# Patient Record
Sex: Male | Born: 1945 | Race: Black or African American | Hispanic: No | Marital: Married | Smoking: Never smoker
Health system: Southern US, Community
[De-identification: ages and names within clinical notes are randomized; demographics above are authoritative.]

## PROBLEM LIST (undated history)

## (undated) DIAGNOSIS — I10 Essential (primary) hypertension: Secondary | ICD-10-CM

## (undated) DIAGNOSIS — C801 Malignant (primary) neoplasm, unspecified: Secondary | ICD-10-CM

## (undated) DIAGNOSIS — E119 Type 2 diabetes mellitus without complications: Secondary | ICD-10-CM

---

## 2013-10-19 ENCOUNTER — Emergency Department (HOSPITAL_COMMUNITY): Payer: Medicare Other

## 2013-10-19 ENCOUNTER — Inpatient Hospital Stay (HOSPITAL_COMMUNITY)
Admission: EM | Admit: 2013-10-19 | Discharge: 2013-10-23 | DRG: 871 | Disposition: A | Discharge status: Hospice | Payer: Medicare Other | Attending: Internal Medicine | Admitting: Internal Medicine

## 2013-10-19 ENCOUNTER — Encounter (HOSPITAL_COMMUNITY): Payer: Self-pay | Admitting: Emergency Medicine

## 2013-10-19 DIAGNOSIS — R609 Edema, unspecified: Secondary | ICD-10-CM | POA: Diagnosis present

## 2013-10-19 DIAGNOSIS — D649 Anemia, unspecified: Secondary | ICD-10-CM

## 2013-10-19 DIAGNOSIS — T50995A Adverse effect of other drugs, medicaments and biological substances, initial encounter: Secondary | ICD-10-CM | POA: Diagnosis not present

## 2013-10-19 DIAGNOSIS — N390 Urinary tract infection, site not specified: Secondary | ICD-10-CM

## 2013-10-19 DIAGNOSIS — R599 Enlarged lymph nodes, unspecified: Secondary | ICD-10-CM | POA: Diagnosis present

## 2013-10-19 DIAGNOSIS — Z66 Do not resuscitate: Secondary | ICD-10-CM | POA: Diagnosis not present

## 2013-10-19 DIAGNOSIS — C7800 Secondary malignant neoplasm of unspecified lung: Secondary | ICD-10-CM | POA: Diagnosis present

## 2013-10-19 DIAGNOSIS — E46 Unspecified protein-calorie malnutrition: Secondary | ICD-10-CM | POA: Diagnosis present

## 2013-10-19 DIAGNOSIS — J189 Pneumonia, unspecified organism: Secondary | ICD-10-CM

## 2013-10-19 DIAGNOSIS — R188 Other ascites: Secondary | ICD-10-CM | POA: Diagnosis present

## 2013-10-19 DIAGNOSIS — G9340 Encephalopathy, unspecified: Secondary | ICD-10-CM | POA: Diagnosis present

## 2013-10-19 DIAGNOSIS — T451X5A Adverse effect of antineoplastic and immunosuppressive drugs, initial encounter: Secondary | ICD-10-CM | POA: Diagnosis present

## 2013-10-19 DIAGNOSIS — R4182 Altered mental status, unspecified: Secondary | ICD-10-CM

## 2013-10-19 DIAGNOSIS — R197 Diarrhea, unspecified: Secondary | ICD-10-CM | POA: Diagnosis not present

## 2013-10-19 DIAGNOSIS — R0682 Tachypnea, not elsewhere classified: Secondary | ICD-10-CM | POA: Diagnosis present

## 2013-10-19 DIAGNOSIS — Z79899 Other long term (current) drug therapy: Secondary | ICD-10-CM

## 2013-10-19 DIAGNOSIS — IMO0002 Reserved for concepts with insufficient information to code with codable children: Secondary | ICD-10-CM

## 2013-10-19 DIAGNOSIS — F411 Generalized anxiety disorder: Secondary | ICD-10-CM | POA: Diagnosis present

## 2013-10-19 DIAGNOSIS — C787 Secondary malignant neoplasm of liver and intrahepatic bile duct: Secondary | ICD-10-CM | POA: Diagnosis present

## 2013-10-19 DIAGNOSIS — E119 Type 2 diabetes mellitus without complications: Secondary | ICD-10-CM | POA: Diagnosis present

## 2013-10-19 DIAGNOSIS — A419 Sepsis, unspecified organism: Principal | ICD-10-CM | POA: Insufficient documentation

## 2013-10-19 DIAGNOSIS — C189 Malignant neoplasm of colon, unspecified: Secondary | ICD-10-CM | POA: Diagnosis present

## 2013-10-19 DIAGNOSIS — I10 Essential (primary) hypertension: Secondary | ICD-10-CM | POA: Diagnosis present

## 2013-10-19 DIAGNOSIS — D6959 Other secondary thrombocytopenia: Secondary | ICD-10-CM | POA: Diagnosis present

## 2013-10-19 DIAGNOSIS — Z515 Encounter for palliative care: Secondary | ICD-10-CM

## 2013-10-19 DIAGNOSIS — D638 Anemia in other chronic diseases classified elsewhere: Secondary | ICD-10-CM | POA: Diagnosis present

## 2013-10-19 HISTORY — DX: Type 2 diabetes mellitus without complications: E11.9

## 2013-10-19 HISTORY — DX: Essential (primary) hypertension: I10

## 2013-10-19 HISTORY — DX: Malignant (primary) neoplasm, unspecified: C80.1

## 2013-10-19 LAB — CBC WITH DIFFERENTIAL/PLATELET
Basophils Absolute: 0 10*3/uL (ref 0.0–0.1)
Basophils Relative: 0 % (ref 0–1)
EOS ABS: 0 10*3/uL (ref 0.0–0.7)
Eosinophils Relative: 0 % (ref 0–5)
HEMATOCRIT: 32.8 % — AB (ref 39.0–52.0)
Hemoglobin: 11.4 g/dL — ABNORMAL LOW (ref 13.0–17.0)
LYMPHS PCT: 48 % — AB (ref 12–46)
Lymphs Abs: 2.7 10*3/uL (ref 0.7–4.0)
MCH: 31.7 pg (ref 26.0–34.0)
MCHC: 34.8 g/dL (ref 30.0–36.0)
MCV: 91.1 fL (ref 78.0–100.0)
MONO ABS: 0.1 10*3/uL (ref 0.1–1.0)
MONOS PCT: 2 % — AB (ref 3–12)
Neutro Abs: 2.9 10*3/uL (ref 1.7–7.7)
Neutrophils Relative %: 50 % (ref 43–77)
Platelets: 109 10*3/uL — ABNORMAL LOW (ref 150–400)
RBC: 3.6 MIL/uL — ABNORMAL LOW (ref 4.22–5.81)
RDW: 14 % (ref 11.5–15.5)
WBC: 5.7 10*3/uL (ref 4.0–10.5)

## 2013-10-19 LAB — URINE MICROSCOPIC-ADD ON

## 2013-10-19 LAB — URINALYSIS, ROUTINE W REFLEX MICROSCOPIC
GLUCOSE, UA: NEGATIVE mg/dL
HGB URINE DIPSTICK: NEGATIVE
KETONES UR: NEGATIVE mg/dL
Nitrite: NEGATIVE
PH: 7.5 (ref 5.0–8.0)
Protein, ur: 30 mg/dL — AB
Specific Gravity, Urine: 1.023 (ref 1.005–1.030)
Urobilinogen, UA: 4 mg/dL — ABNORMAL HIGH (ref 0.0–1.0)

## 2013-10-19 LAB — HEPATIC FUNCTION PANEL
ALBUMIN: 2.8 g/dL — AB (ref 3.5–5.2)
ALK PHOS: 148 U/L — AB (ref 39–117)
ALT: 15 U/L (ref 0–53)
AST: 25 U/L (ref 0–37)
Bilirubin, Direct: 0.2 mg/dL (ref 0.0–0.3)
Indirect Bilirubin: 0.3 mg/dL (ref 0.3–0.9)
Total Bilirubin: 0.5 mg/dL (ref 0.3–1.2)
Total Protein: 6.7 g/dL (ref 6.0–8.3)

## 2013-10-19 LAB — BASIC METABOLIC PANEL
BUN: 15 mg/dL (ref 6–23)
CALCIUM: 9.3 mg/dL (ref 8.4–10.5)
CHLORIDE: 94 meq/L — AB (ref 96–112)
CO2: 25 meq/L (ref 19–32)
CREATININE: 0.87 mg/dL (ref 0.50–1.35)
GFR calc Af Amer: >90 mL/min (ref >90)
GFR calc non Af Amer: 86 mL/min — ABNORMAL LOW (ref >90)
Glucose, Bld: 120 mg/dL — ABNORMAL HIGH (ref 70–99)
Potassium: 4.3 mEq/L (ref 3.7–5.3)
Sodium: 133 mEq/L — ABNORMAL LOW (ref 137–147)

## 2013-10-19 LAB — GLUCOSE, CAPILLARY: Glucose-Capillary: 103 mg/dL — ABNORMAL HIGH (ref 70–99)

## 2013-10-19 LAB — POCT I-STAT TROPONIN I: Troponin i, poc: 0.01 ng/mL (ref 0.00–0.08)

## 2013-10-19 LAB — LIPASE, BLOOD: Lipase: 11 U/L (ref 11–59)

## 2013-10-19 MED ORDER — VANCOMYCIN HCL 10 G IV SOLR
1500.0000 mg | Freq: Once | INTRAVENOUS | Status: AC
  Administered 2013-10-19: 1500 mg via INTRAVENOUS
  Filled 2013-10-19: qty 1500

## 2013-10-19 MED ORDER — DEXTROSE 5 % IV SOLN
2.0000 g | Freq: Once | INTRAVENOUS | Status: DC
  Filled 2013-10-19: qty 2

## 2013-10-19 MED ORDER — SODIUM CHLORIDE 0.9 % IV BOLUS (SEPSIS): 500.0000 mL | Freq: Once | INTRAVENOUS | Status: DC

## 2013-10-19 NOTE — ED Notes (Signed)
Unsuccessful lab draw by this writer. RN made aware. 

## 2013-10-19 NOTE — H&P (Signed)
[facility] History and Physical  Jonathon Hall Jonathon Hall:[id number] DOB: 05/14/1946 DOA: 10/19/2013  Referring physician: ED PCP: follows with Dr Golden Pop , oncologist in [place] ( Phone [phone])  Chief Complaint:  Altered mentals status for past 4-5 days  HPI:  68 year old male with past medical hx of hypertension, diabetes, colon recurrence in 2014 with 10 cc to the liver and the lung and currently on chemotherapy every other week since April 2014. Patient follows with Dr. Lyda Kalata in [facility] and he is here visiting his daughter. Patient has been found to be increasingly sleepy during conversation for4-5 days. Today he was increasingly lethargic and confused. His oncologist had advised him to take oxycodone for his lower abdominal pain as needed and patient reports taking oxycodone 5-6 times a day. Family feels that he may be taking more pills than usual. Patient also was having chills and nasal congestion for last few days.  Patient denies headache, dizziness,  nausea , vomiting, chest pain, palpitations, SOB, , bowel or urinary symptoms. Wife reports significant loss of appetite. No history of fall or syncope. No recent change in medications.  Course in the ED Patient was febrile to 100.67F. Blood pressure was 168/92 mmHg. O2 sat was stable on room air. Blood work done that showed hemoglobin of 11.4, platelets of 109, sodium 133, potassium 4.3, chloride 94, glucose 120. Chest x-ray done that showed diffuse nodular changes throughout both lungs consistent with metastatic disease. UA was suggestive of UTI. Head CT was unremarkable. Patient given 500 cc normal saline bolus. Blood culture was ordered from the ED and patient given an empiric dose of IV vancomycin and cefepime. [facility] called for admission to medical floor. During my evaluation patient is oriented but slow during conversation and irritable at times.  Review of Systems:  Constitutional:  Chills, poor appetite and fatigue. Denies fever, diaphoresis, HEENT: Nasal congestion and cough, Denies photophobia, eye pain, redness, hearing loss, ear pain,  sore throat, rhinorrhea, sneezing, mouth sores, trouble swallowing, neck pain, neck stiffness and tinnitus.   Respiratory: Denies SOB, DOE,  chest tightness,  and wheezing.   Cardiovascular: Denies chest pain, palpitations and leg swelling.  Gastrointestinal: Lower abdominal pain, Denies nausea, vomiting, diarrhea, constipation, blood in stool and abdominal distention.  Genitourinary: Denies dysuria, urgency, frequency, hematuria, flank pain and difficulty urinating.  Endocrine: Denies hot or cold intolerance, polyuria, polydipsia. Musculoskeletal: Denies myalgias, back pain, joint swelling, arthralgias and gait problem.  Skin: Denies pallor, rash and wound.  Neurological: Weakness,  Denies dizziness, seizures, syncope, , light-headedness, numbness and headaches.  Hematological: Denies adenopathy.  Psychiatric/Behavioral: Mood changes and confusion. Denies  sleep disturbance and agitation   Past Medical History  Diagnosis Date  . Cancer     colon cancer  . Hypertension   . Diabetes mellitus without complication    History reviewed. No pertinent past surgical history. Social History:  reports that he has never smoked. He does not have any smokeless tobacco history on file. He reports that he does not drink alcohol. His drug history is not on file.  No Known Allergies  No family history on file.  Prior to Admission medications   Medication Sig Start Date End Date Taking? Authorizing Provider  lisinopril (PRINIVIL,ZESTRIL) 20 MG tablet Take 20 mg by mouth daily.   Yes Historical Provider, MD  metFORMIN (GLUCOPHAGE) 1000 MG tablet Take 1,000 mg by mouth 2 (two) times daily with a meal.   Yes Historical Provider, MD  oxycodone (ROXICODONE) 30 MG  immediate release tablet Take 30 mg by mouth every 4 (four) hours as needed for pain.    Yes Historical Provider, MD     Physical Exam:  Filed Vitals:   10/19/13 1929 10/19/13 2308  BP: 168/92   Pulse: 92 85  Temp: 100.4 F (38 C) 99.5 F (37.5 C)  TempSrc: Oral Oral  Resp: 14 16  SpO2: 96% 93%    Constitutional: Vital signs reviewed. Patient is an elderly thin built male lying in bed in no acute distress  HEENT: Pupils reactive bilaterally, no pallor, no icterus, dry oral mucosa, no cervical lymphadenopathy Cardiovascular: RRR, S1 normal, S2 normal, no MRG Chest: Right-sided Port-A-Cath appears clean , Coarse breath sounds bilaterally, no wheezes, or rhonchi Abdominal: surgical scar over abdomen with protruded mass ? Closed colostomy site. Soft. Non-tender, non-distended, bowel sounds are normal, no guarding or rigidity. GU: no CVA tenderness Ext: warm, trace edema  Neurological: A&O x3, slow to respond with episodes of confusion. Flapping tremors  Labs on Admission:  Basic Metabolic Panel:  Recent Labs Lab 10/19/13 2035  NA 133*  K 4.3  CL 94*  CO2 25  GLUCOSE 120*  BUN 15  CREATININE 0.87  CALCIUM 9.3   Liver Function Tests:  Recent Labs Lab 10/19/13 2035  AST 25  ALT 15  ALKPHOS 148*  BILITOT 0.5  PROT 6.7  ALBUMIN 2.8*    Recent Labs Lab 10/19/13 2035  LIPASE 11   No results found for this basename: AMMONIA,  in the last 168 hours CBC:  Recent Labs Lab 10/19/13 2035  WBC 5.7  NEUTROABS 2.9  HGB 11.4*  HCT 32.8*  MCV 91.1  PLT 109*   Cardiac Enzymes: No results found for this basename: CKTOTAL, CKMB, CKMBINDEX, TROPONINI,  in the last 168 hours BNP: No components found with this basename: POCBNP,  CBG:  Recent Labs Lab 10/19/13 1933  GLUCAP 103*    Radiological Exams on Admission: Dg Chest 2 View  10/19/2013   CLINICAL DATA:  Fever on chemotherapy  EXAM: CHEST  2 VIEW  COMPARISON:  None.  FINDINGS: Cardiac shadow is within normal limits. Nodular changes are noted throughout both lungs likely related to the  metastatic disease. No prior films were available for comparison. A right chest wall port is seen in satisfactory position. No focal confluent infiltrate or effusion is seen. The bony structures are grossly unremarkable.  IMPRESSION: Diffuse nodular changes throughout both lungs most consistent with metastatic disease. No focal infiltrate is seen.   Electronically Signed   By: Inez Catalina M.D.   On: 10/19/2013 21:18   Ct Head Wo Contrast  10/19/2013   CLINICAL DATA:  Abnormal mental status. Stage IV colon cancer. Patient directed to the ER after calling primary care physician.  EXAM: CT HEAD WITHOUT CONTRAST  TECHNIQUE: Contiguous axial images were obtained from the base of the skull through the vertex without intravenous contrast.  COMPARISON:  None.  FINDINGS: No mass lesion, mass effect, midline shift, hydrocephalus, hemorrhage. No acute territorial cortical ischemia/infarct. Atrophy and chronic ischemic white matter disease is present. Atrophy is less than expected for age. Small benign-appearing calcification adjacent to the cingulate gyrus. Allowing for limitations of noncontrast technique, there is no evidence of intracranial metastatic disease. Dense intracranial atherosclerosis. The mastoid air cells appear clear. Pansinusitis is present with apparent fluid levels in the maxillary sinuses, suggesting acute sinusitis. Similar changes are present in the right frontal sinus.  IMPRESSION: 1. No acute intracranial abnormality. Chronic ischemic white  matter disease. 2. Pansinusitis with scattered fluid levels which can be associated with acute sinusitis in the appropriate clinical setting.   Electronically Signed   By: Dereck Ligas M.D.   On: 10/19/2013 21:20       Assessment/Plan  Active Problems:   Acute encephalopathy Possibly in the setting of overuse of narcotic. Patient oriented during my evaluation but has some episode of confusion. He does not have any abdominal discomfort at this time. It  appears that he has been taking extra dose of oxycodone for pain. -Admit to MedSurg. Neurochecks every 4 hours.  -hold oxycodone at this time until mental status improves.  -Head CT unremarkable. He also has some underlying UTI which could contribute to the symptoms. Patient febrile on presentation. We'll check for flu PCR. -cover empirically with IV vancomycin and cefepime for now.  narrow down abx if remain afebrile and mental status improves tomorrow.     UTI (urinary tract infection)  Follow urine culture.  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 Please cll his oncologist in am to gather further information. Patient reports he may have had scns to his brain recently. i will hold off getting an MRI of the brain assuming this is mainly effect of narcotic and UTI rather than brain mets. -Will place on albuterol nebs when necessary Continue O2 via nasal cannula    Anemia Monitor H&H. No baseline available    Diabetes mellitus Hold metformin. We'll place on sliding scale insulin    Hypertension Resume lisinopril   Bilateral leg edema Possibly from hypoalbuminemia. appears malnourished. obtain nutrition consult.   Diet:clears, advance once mental status improves  DVT prophylaxis: sq lovenox, monitor low platelets   Code Status: full code Family Communication: discussed with wife, son and daughter at bedside Disposition Plan: return home once improved  Schwanda Zima, [place] [facility] Pager [phone]  Total time spent on admission :70 minutes  If 7PM-7AM, please contact night-coverage [email or web address] Password [facility] 10/19/2013, 11:28 PM

## 2013-10-19 NOTE — ED Notes (Signed)
EMS called to family home.  Found patient in chair with complaints of weakness. Patient is stage IV colon cancer and called his PCP and was instructed to come  To the ED to be evaluated.  Patient is unable to rate pain but does grimace with  The left rib cage area

## 2013-10-19 NOTE — ED Notes (Signed)
Only one set of blood cultures were obtained due to poor access.

## 2013-10-19 NOTE — ED Provider Notes (Signed)
CSN: [id number]     Arrival date & time 10/19/13  1914 History   First MD Initiated Contact with Patient 10/19/13 1934     No chief complaint on file.    (Consider location/radiation/quality/duration/timing/severity/associated sxs/prior Treatment) HPI Comments: 68 year old male with a history of stage IV colon cancer currently on chemotherapy presents with family for weakness and altered mental status. History taken from family because the patient is somnolent and doesn't answer questions. Per the family's been having more and more weakness over the past week. One and a half weeks ago he received his most recent chemotherapy treatment through his port. Denies any fevers. Denies any vomiting. At this time his chemotherapy is not palliative. His normal oncologist is in [place], [place]. He is up here visiting family. The patient takes 30 mg immediate release oxycodone but has not been taking more than recommended. Is not normally gets sleepy when taking his oxycodone. Family had trouble getting the patient up to walk so they brought him into the ER.   Past Medical History  Diagnosis Date  . Cancer     colon cancer  . Hypertension   . Diabetes mellitus without complication    History reviewed. No pertinent past surgical history. No family history on file. History  Substance Use Topics  . Smoking status: Never Smoker   . Smokeless tobacco: Not on file  . Alcohol Use: No    Review of Systems  Unable to perform ROS: Mental status change      Allergies  Review of patient's allergies indicates no known allergies.  Home Medications   Current Outpatient Rx  Name  Route  Sig  Dispense  Refill  . lisinopril (PRINIVIL,ZESTRIL) 20 MG tablet   Oral   Take 20 mg by mouth daily.         . metFORMIN (GLUCOPHAGE) 1000 MG tablet   Oral   Take 1,000 mg by mouth 2 (two) times daily with a meal.         . oxycodone (ROXICODONE) 30 MG immediate release tablet   Oral   Take  30 mg by mouth every 4 (four) hours as needed for pain.          BP 168/92  Pulse 92  Temp(Src) 100.4 F (38 C) (Oral)  Resp 14  SpO2 96% Physical Exam  Nursing note and vitals reviewed. Constitutional: He appears well-developed and well-nourished. No distress.  HENT:  Head: Normocephalic and atraumatic.  Right Ear: External ear normal.  Left Ear: External ear normal.  Nose: Nose normal.  Dry mucous membranes  Eyes: Right eye exhibits no discharge. Left eye exhibits no discharge.  Neck: Neck supple.  Cardiovascular: Normal rate, regular rhythm, normal heart sounds and intact distal pulses.   Pulmonary/Chest: Effort normal.  Abdominal: Soft. There is tenderness.  Musculoskeletal: He exhibits no edema.  Neurological: He is alert. He is disoriented. GCS eye subscore is 3. GCS verbal subscore is 4. GCS motor subscore is 6.  Grossly normal strength in all 4 extremities. For CN testing he does not follow commands. When asking to open his eyes he states he can't, then forcibly resists me trying to pull up his eyelids.   Skin: Skin is warm and dry.    ED Course  Procedures (including critical care time) Labs Review Labs Reviewed  CBC WITH DIFFERENTIAL - Abnormal; Notable for the following:    RBC 3.60 (*)    Hemoglobin 11.4 (*)    HCT 32.8 (*)  Platelets 109 (*)    Lymphocytes Relative 48 (*)    Monocytes Relative 2 (*)    All other components within normal limits  BASIC METABOLIC PANEL - Abnormal; Notable for the following:    Sodium 133 (*)    Chloride 94 (*)    Glucose, Bld 120 (*)    GFR calc non Af Amer 86 (*)    All other components within normal limits  URINALYSIS, ROUTINE W REFLEX MICROSCOPIC - Abnormal; Notable for the following:    Color, Urine AMBER (*)    Bilirubin Urine SMALL (*)    Protein, ur 30 (*)    Urobilinogen, UA 4.0 (*)    Leukocytes, UA TRACE (*)    All other components within normal limits  GLUCOSE, CAPILLARY - Abnormal; Notable for the  following:    Glucose-Capillary 103 (*)    All other components within normal limits  HEPATIC FUNCTION PANEL - Abnormal; Notable for the following:    Albumin 2.8 (*)    Alkaline Phosphatase 148 (*)    All other components within normal limits  URINE MICROSCOPIC-ADD ON - Abnormal; Notable for the following:    Casts HYALINE CASTS (*)    All other components within normal limits  CULTURE, BLOOD (ROUTINE X 2)  CULTURE, BLOOD (ROUTINE X 2)  LIPASE, BLOOD  POCT I-STAT TROPONIN I   Imaging Review Dg Chest 2 View  10/19/2013   CLINICAL DATA:  Fever on chemotherapy  EXAM: CHEST  2 VIEW  COMPARISON:  None.  FINDINGS: Cardiac shadow is within normal limits. Nodular changes are noted throughout both lungs likely related to the metastatic disease. No prior films were available for comparison. A right chest wall port is seen in satisfactory position. No focal confluent infiltrate or effusion is seen. The bony structures are grossly unremarkable.  IMPRESSION: Diffuse nodular changes throughout both lungs most consistent with metastatic disease. No focal infiltrate is seen.   Electronically Signed   By: Inez Catalina M.D.   On: 10/19/2013 21:18   Ct Head Wo Contrast  10/19/2013   CLINICAL DATA:  Abnormal mental status. Stage IV colon cancer. Patient directed to the ER after calling primary care physician.  EXAM: CT HEAD WITHOUT CONTRAST  TECHNIQUE: Contiguous axial images were obtained from the base of the skull through the vertex without intravenous contrast.  COMPARISON:  None.  FINDINGS: No mass lesion, mass effect, midline shift, hydrocephalus, hemorrhage. No acute territorial cortical ischemia/infarct. Atrophy and chronic ischemic white matter disease is present. Atrophy is less than expected for age. Small benign-appearing calcification adjacent to the cingulate gyrus. Allowing for limitations of noncontrast technique, there is no evidence of intracranial metastatic disease. Dense intracranial  atherosclerosis. The mastoid air cells appear clear. Pansinusitis is present with apparent fluid levels in the maxillary sinuses, suggesting acute sinusitis. Similar changes are present in the right frontal sinus.  IMPRESSION: 1. No acute intracranial abnormality. Chronic ischemic white matter disease. 2. Pansinusitis with scattered fluid levels which can be associated with acute sinusitis in the appropriate clinical setting.   Electronically Signed   By: Dereck Ligas M.D.   On: 10/19/2013 21:20    EKG Interpretation    Date/Time:  Thursday October 19 2013 19:17:35 EST Ventricular Rate:  88 PR Interval:  138 QRS Duration: 76 QT Interval:  367 QTC Calculation: 444 R Axis:   8 Text Interpretation:  Age not entered, assumed to be  68 years old for purpose of ECG interpretation Sinus rhythm Anteroseptal infarct,  old Abnrm T, consider ischemia, anterolateral lds No old tracing to compare Confirmed by [place]  MD, [place] [phone]) on 10/19/2013 8:09:26 PM            MDM   Final diagnoses:  Altered mental status    Patient's mental status somewhat improved, appeared awake and talkative. However, appears confused and slightly altered. Family confirms this is not his normal self. Given this with his history of chemo and metastatic disease, will need to be brought in to hospital for further w/u. Given his low grade temps, will cover broadly given his immunosuppressed state and high chance of blood borne infections with chest port in place.    Ephraim Hamburger, MD 10/19/13 [phone]

## 2013-10-20 ENCOUNTER — Inpatient Hospital Stay (HOSPITAL_COMMUNITY): Payer: Medicare Other

## 2013-10-20 ENCOUNTER — Encounter (HOSPITAL_COMMUNITY): Payer: Self-pay | Admitting: *Deleted

## 2013-10-20 DIAGNOSIS — C787 Secondary malignant neoplasm of liver and intrahepatic bile duct: Secondary | ICD-10-CM

## 2013-10-20 DIAGNOSIS — M7989 Other specified soft tissue disorders: Secondary | ICD-10-CM

## 2013-10-20 DIAGNOSIS — E119 Type 2 diabetes mellitus without complications: Secondary | ICD-10-CM

## 2013-10-20 DIAGNOSIS — I10 Essential (primary) hypertension: Secondary | ICD-10-CM

## 2013-10-20 DIAGNOSIS — A419 Sepsis, unspecified organism: Secondary | ICD-10-CM | POA: Insufficient documentation

## 2013-10-20 LAB — GLUCOSE, CAPILLARY
GLUCOSE-CAPILLARY: 167 mg/dL — AB (ref 70–99)
Glucose-Capillary: 103 mg/dL — ABNORMAL HIGH (ref 70–99)
Glucose-Capillary: 106 mg/dL — ABNORMAL HIGH (ref 70–99)
Glucose-Capillary: 129 mg/dL — ABNORMAL HIGH (ref 70–99)

## 2013-10-20 LAB — INFLUENZA PANEL BY PCR (TYPE A & B)
H1N1 flu by pcr: NOT DETECTED
INFLAPCR: NEGATIVE
Influenza B By PCR: NEGATIVE

## 2013-10-20 LAB — BASIC METABOLIC PANEL
BUN: 14 mg/dL (ref 6–23)
CO2: 23 mEq/L (ref 19–32)
CREATININE: 0.78 mg/dL (ref 0.50–1.35)
Calcium: 9.1 mg/dL (ref 8.4–10.5)
Chloride: 94 mEq/L — ABNORMAL LOW (ref 96–112)
GFR calc Af Amer: >90 mL/min (ref >90)
Glucose, Bld: 123 mg/dL — ABNORMAL HIGH (ref 70–99)
Potassium: 4 mEq/L (ref 3.7–5.3)
SODIUM: 132 meq/L — AB (ref 137–147)

## 2013-10-20 LAB — CBC
HCT: 33.7 % — ABNORMAL LOW (ref 39.0–52.0)
Hemoglobin: 11.7 g/dL — ABNORMAL LOW (ref 13.0–17.0)
MCH: 31.5 pg (ref 26.0–34.0)
MCHC: 34.7 g/dL (ref 30.0–36.0)
MCV: 90.6 fL (ref 78.0–100.0)
Platelets: 100 10*3/uL — ABNORMAL LOW (ref 150–400)
RBC: 3.72 MIL/uL — AB (ref 4.22–5.81)
RDW: 13.9 % (ref 11.5–15.5)
WBC: 5.8 10*3/uL (ref 4.0–10.5)

## 2013-10-20 LAB — AMMONIA: Ammonia: 21 umol/L (ref 11–60)

## 2013-10-20 MED ORDER — HYDRALAZINE HCL 20 MG/ML IJ SOLN
10.0000 mg | Freq: Four times a day (QID) | INTRAMUSCULAR | Status: DC | PRN
Start: 2013-10-20 — End: 2013-10-20
  Filled 2013-10-20: qty 0.5

## 2013-10-20 MED ORDER — ONDANSETRON HCL 4 MG/2ML IJ SOLN
4.0000 mg | Freq: Four times a day (QID) | INTRAMUSCULAR | Status: DC | PRN
  Administered 2013-10-21 – 2013-10-23 (×3): 4 mg via INTRAVENOUS
  Filled 2013-10-20 (×2): qty 2

## 2013-10-20 MED ORDER — LABETALOL HCL 100 MG PO TABS
100.0000 mg | ORAL_TABLET | Freq: Three times a day (TID) | ORAL | Status: DC
  Administered 2013-10-20 (×2): 100 mg via ORAL
  Filled 2013-10-20 (×5): qty 1

## 2013-10-20 MED ORDER — ACETAMINOPHEN 650 MG RE SUPP: 650.0000 mg | Freq: Four times a day (QID) | RECTAL | Status: DC | PRN

## 2013-10-20 MED ORDER — ENOXAPARIN SODIUM 40 MG/0.4ML ~~LOC~~ SOLN
40.0000 mg | SUBCUTANEOUS | Status: DC
Start: 2013-10-20 — End: 2013-10-21
  Administered 2013-10-20: 40 mg via SUBCUTANEOUS
  Filled 2013-10-20 (×2): qty 0.4

## 2013-10-20 MED ORDER — VANCOMYCIN HCL IN DEXTROSE 750-5 MG/150ML-% IV SOLN
750.0000 mg | Freq: Two times a day (BID) | INTRAVENOUS | Status: DC
  Administered 2013-10-20 – 2013-10-22 (×5): 750 mg via INTRAVENOUS
  Filled 2013-10-20 (×6): qty 150

## 2013-10-20 MED ORDER — IOHEXOL 300 MG/ML  SOLN: 25.0000 mL | Freq: Once | INTRAMUSCULAR | Status: AC | PRN

## 2013-10-20 MED ORDER — HYDRALAZINE HCL 20 MG/ML IJ SOLN
20.0000 mg | Freq: Four times a day (QID) | INTRAMUSCULAR | Status: DC | PRN
  Administered 2013-10-20: 10 mg via INTRAVENOUS
  Administered 2013-10-22 – 2013-10-23 (×2): 20 mg via INTRAVENOUS
  Filled 2013-10-20 (×2): qty 1

## 2013-10-20 MED ORDER — HYDRALAZINE HCL 25 MG PO TABS
25.0000 mg | ORAL_TABLET | Freq: Three times a day (TID) | ORAL | Status: DC
  Administered 2013-10-20 (×3): 25 mg via ORAL
  Filled 2013-10-20 (×6): qty 1

## 2013-10-20 MED ORDER — CHLORPROMAZINE HCL 10 MG PO TABS
10.0000 mg | ORAL_TABLET | Freq: Three times a day (TID) | ORAL | Status: DC | PRN
  Administered 2013-10-20 – 2013-10-22 (×2): 10 mg via ORAL
  Filled 2013-10-20 (×2): qty 1

## 2013-10-20 MED ORDER — IOHEXOL 300 MG/ML  SOLN
80.0000 mL | Freq: Once | INTRAMUSCULAR | Status: AC | PRN
  Administered 2013-10-20: 80 mL via INTRAVENOUS

## 2013-10-20 MED ORDER — DEXTROSE 5 % IV SOLN
1.0000 g | Freq: Three times a day (TID) | INTRAVENOUS | Status: DC
  Administered 2013-10-20 – 2013-10-22 (×6): 1 g via INTRAVENOUS
  Filled 2013-10-20 (×8): qty 1

## 2013-10-20 MED ORDER — ENSURE COMPLETE PO LIQD
237.0000 mL | Freq: Two times a day (BID) | ORAL | Status: DC
  Administered 2013-10-20 – 2013-10-23 (×7): 237 mL via ORAL

## 2013-10-20 MED ORDER — HYDRALAZINE HCL 20 MG/ML IJ SOLN
10.0000 mg | Freq: Once | INTRAMUSCULAR | Status: AC
  Administered 2013-10-20: 10 mg via INTRAVENOUS
  Filled 2013-10-20: qty 0.5

## 2013-10-20 MED ORDER — ONDANSETRON HCL 4 MG PO TABS: 4.0000 mg | ORAL_TABLET | Freq: Four times a day (QID) | ORAL | Status: DC | PRN

## 2013-10-20 MED ORDER — IOHEXOL 350 MG/ML SOLN
100.0000 mL | Freq: Once | INTRAVENOUS | Status: AC | PRN
  Administered 2013-10-20: 100 mL via INTRAVENOUS

## 2013-10-20 MED ORDER — HYDRALAZINE HCL 20 MG/ML IJ SOLN
10.0000 mg | Freq: Once | INTRAMUSCULAR | Status: AC
  Administered 2013-10-20: 10 mg via INTRAVENOUS
  Filled 2013-10-20 (×2): qty 0.5

## 2013-10-20 MED ORDER — ACETAMINOPHEN 325 MG PO TABS
650.0000 mg | ORAL_TABLET | Freq: Four times a day (QID) | ORAL | Status: DC | PRN
  Administered 2013-10-20: 650 mg via ORAL
  Filled 2013-10-20 (×2): qty 2

## 2013-10-20 MED ORDER — SODIUM CHLORIDE 0.9 % IV SOLN
INTRAVENOUS | Status: DC
  Administered 2013-10-20 – 2013-10-21 (×2): via INTRAVENOUS

## 2013-10-20 MED ORDER — INSULIN ASPART 100 UNIT/ML ~~LOC~~ SOLN
0.0000 [IU] | Freq: Three times a day (TID) | SUBCUTANEOUS | Status: DC
  Administered 2013-10-20: 2 [IU] via SUBCUTANEOUS
  Administered 2013-10-20: 1 [IU] via SUBCUTANEOUS
  Administered 2013-10-21: 2 [IU] via SUBCUTANEOUS
  Administered 2013-10-21: 3 [IU] via SUBCUTANEOUS
  Administered 2013-10-21 – 2013-10-22 (×2): 1 [IU] via SUBCUTANEOUS
  Administered 2013-10-22 (×2): 2 [IU] via SUBCUTANEOUS
  Administered 2013-10-23 (×2): 1 [IU] via SUBCUTANEOUS

## 2013-10-20 MED ORDER — DOCUSATE SODIUM 100 MG PO CAPS
100.0000 mg | ORAL_CAPSULE | Freq: Every day | ORAL | Status: DC | PRN
  Filled 2013-10-20: qty 1

## 2013-10-20 MED ORDER — CEFEPIME HCL 2 G IJ SOLR
2.0000 g | INTRAMUSCULAR | Status: AC
  Administered 2013-10-20: 2 g via INTRAVENOUS
  Filled 2013-10-20: qty 2

## 2013-10-20 MED ORDER — LISINOPRIL 20 MG PO TABS
20.0000 mg | ORAL_TABLET | Freq: Every day | ORAL | Status: DC
  Administered 2013-10-20: 20 mg via ORAL
  Filled 2013-10-20 (×2): qty 1

## 2013-10-20 NOTE — Progress Notes (Signed)
ANTIBIOTIC CONSULT NOTE - INITIAL  Pharmacy Consult for Cefepime/Vancomycin Indication: Sepsis  No Known Allergies  Patient Measurements: Height: 5\' 7"  (170.2 cm) Weight: 144 lb 13.5 oz (65.7 kg) IBW/kg (Calculated) : 66.1   Vital Signs: Temp: 100.6 F (38.1 C) (02/13 0003) Temp src: Oral (02/13 0003) BP: 162/79 mmHg (02/13 0100) Pulse Rate: 90 (02/13 0100) Intake/Output from previous day: 02/12 0701 - 02/13 0700 In: -  Out: 100 [Urine:100] Intake/Output from this shift: Total I/O In: -  Out: 100 [Urine:100]  Labs:  Recent Labs  10/19/13 2035 10/20/13 0233  WBC 5.7 5.8  HGB 11.4* 11.7*  PLT 109* 100*  CREATININE 0.87 0.78   Estimated Creatinine Clearance: 81 ml/min (by C-G formula based on Cr of 0.78). No results found for this basename: VANCOTROUGH, VANCOPEAK, VANCORANDOM, GENTTROUGH, GENTPEAK, GENTRANDOM, TOBRATROUGH, TOBRAPEAK, TOBRARND, AMIKACINPEAK, AMIKACINTROU, AMIKACIN,  in the last 72 hours   Microbiology: No results found for this or any previous visit (from the past 720 hour(s)).  Medical History: Past Medical History  Diagnosis Date  . Cancer     colon cancer  . Hypertension   . Diabetes mellitus without complication     Medications:  Scheduled:  . ceFEPime (MAXIPIME) IV  1 g Intravenous Q8H  . ceFEPime (MAXIPIME) IV  2 g Intravenous STAT  . enoxaparin (LOVENOX) injection  40 mg Subcutaneous Q24H  . insulin aspart  0-9 Units Subcutaneous TID WC  . lisinopril  20 mg Oral Daily  . sodium chloride  500 mL Intravenous Once  . vancomycin  750 mg Intravenous Q12H   Infusions:  . sodium chloride 75 mL/hr at 10/20/13 0056   Assessment: 68 yo with hx HTN, DM, colon Ca receiving chemo @ [facility] admitted today with altered mental status x 4-5 days.  Cefepime and Vancomycin per Rx for Sepsis.  Goal of Therapy:  Vancomycin trough level 15-20 mcg/ml  Plan:   Cefepime 2Gm x1 then 1Gm IV q8h  Vancomycin 1500mg  x1 (ER MD) then 750mg  IV  q12h  F/U SCr/cultures/levels as needed.  Dorrene German 10/20/2013,4:46 AM

## 2013-10-20 NOTE — Progress Notes (Signed)
VASCULAR LAB PRELIMINARY  PRELIMINARY  PRELIMINARY  PRELIMINARY  Bilateral lower extremity venous duplex completed.    Preliminary report:  Bilateral:  No evidence of DVT, superficial thrombosis, or Baker's Cyst.   Pakou Rainbow, RVS 10/20/2013, 12:08 PM

## 2013-10-20 NOTE — Progress Notes (Signed)
INITIAL NUTRITION ASSESSMENT  DOCUMENTATION CODES Per approved criteria  -Not Applicable   INTERVENTION: - Ensure Complete BID - Will continue to monitor   NUTRITION DIAGNOSIS: Increased nutrient needs related to metastatic colon CA as evidenced by MD notes.   Goal: Pt to consume >90% of meals/supplements  Monitor:  Weights, labs, intake   Reason for Assessment: Malnutrition screening tool   68 y.o. male  Admitting Dx: Altered mental status for past 4-5 days  ASSESSMENT: Pt with history hypertension, diabetes, colon CA with recurrence in 2014 with metastases to the liver and the lung and currently on chemotherapy every other week since April 2014. Patient has been found to be more lethargic and confused. His oncologist had advised him to take oxycodone for his lower abdominal pain as needed and patient reports taking oxycodone 5-6 times a day. Family feels that he may be taking more pills than usual. Patient also was having chills and nasal congestion for last few days. Wife reports significant loss of appetite.  Met with pt and wife, pt was working on lunch during visit. Pt reports eating well PTA with stable weight however wife was shaking her head and indicating pt with significant weight loss but did not report how much. No weight trend available. PO intake has been 50% of meals.   Alk phos elevated    Height: Ht Readings from Last 1 Encounters:  10/20/13 _0  (1.702 m)    Weight: Wt Readings from Last 1 Encounters:  10/20/13 144 lb 13.5 oz (65.7 kg)    Ideal Body Weight: 148 lb   % Ideal Body Weight: 97%  Wt Readings from Last 10 Encounters:  10/20/13 144 lb 13.5 oz (65.7 kg)    Usual Body Weight: Pt unsure    BMI:  Body mass index is 22.68 kg/(m^2).  Estimated Nutritional Needs: Kcal: 1650-1850 Protein: 80-90g Fluid: 1.6-1.8L/day  Skin: Non-pitting RLE, LLE edema  Diet Order: General  EDUCATION NEEDS: -No education needs identified at this  time   Intake/Output Summary (Last 24 hours) at 10/20/13 1511 Last data filed at 10/20/13 1430  Gross per 24 hour  Intake    120 ml  Output    100 ml  Net     20 ml    Last BM: 2/11  Labs:   Recent Labs Lab 10/19/13 2035 10/20/13 0233  NA 133* 132*  K 4.3 4.0  CL 94* 94*  CO2 25 23  BUN 15 14  CREATININE 0.87 0.78  CALCIUM 9.3 9.1  GLUCOSE 120* 123*    CBG (last 3)   Recent Labs  10/20/13 0058 10/20/13 0757 10/20/13 1208  GLUCAP 103* 106* 167*    Scheduled Meds: . ceFEPime (MAXIPIME) IV  1 g Intravenous Q8H  . enoxaparin (LOVENOX) injection  40 mg Subcutaneous Q24H  . feeding supplement (ENSURE COMPLETE)  237 mL Oral BID BM  . hydrALAZINE  25 mg Oral TID  . insulin aspart  0-9 Units Subcutaneous TID WC  . lisinopril  20 mg Oral Daily  . sodium chloride  500 mL Intravenous Once  . vancomycin  750 mg Intravenous Q12H    Continuous Infusions: . sodium chloride 75 mL/hr at 10/20/13 0056    Past Medical History  Diagnosis Date  . Cancer     colon cancer  . Hypertension   . Diabetes mellitus without complication     History reviewed. No pertinent past surgical history.  Mikey College MS, [place], [place] Pager [phone] After Hours Pager

## 2013-10-20 NOTE — Progress Notes (Addendum)
[facility] PROGRESS NOTE  Jonathon Hall JKK:[id number] DOB: [birth year] DOA: 10/19/2013 PCP: No primary provider on file.  Assessment/Plan  Acute encephalopathy, may be due to overuse of narcotic or delirium from ongoing fevers due to infection or progressive malignancy.   -  Continue neuro checks q4h today -  Minimize sedating medications -  CT head neg -  Consider MRI brain to eval for metastatic cancer  SIRS (fever and tachypnea), unclear source, WBC count currently normal, however, patient receiving ongoing chemotherapy.  Also consider PE given tachypnea and metastatic cancer.   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  Started on FOLFOX but had progression so changed to FOLFIRI and Avastin summer 2014.  Continues to have progression.  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  All treatments are palliative.  Per last clinic note 10/12/2013, "An open and frank discussion was held separately with [patient's] son regarding realistic expectations from treatment and prognosis." Dr. Golden Pop, primary oncologist.  I am worried that this patient has a very limited life expectancy and poor prognosis.  Unless he improves with antibiotics, I would anticipate death in days to weeks.   -  Discussed code status today with wife, who stated that Jonathon Hall would want everything done to extend his life, even painful or uncomfortable procedures, including intubation, CPR, etc.   -   Stated he is not currently a good candidate for chemotherapy  because of his probable sepsis.  If he recovers well from this infection, chemotherapy can be readdressed by his oncologist -  Discussed case with Dr. Howie Ill, one of Dr. Lynnell Jude partners as he is out of town for the next week, who stated that if Jonathon Hall is not recovering well from this current insult, based on the progression of cancer and Dr. Lynnell Jude notes, she would support hospice care/palliative care.   -  Will discuss palliative care consult with family again tomorrow  Anemia of chronic disease, hemoglobin stable, no need for transfusion  Thrombocytopenia, mild and stable, acute phase reactant.  Monitor clinically for signs of DIC -  Repeat CBC in AM  Diabetes mellitus CBG well controlled -  A1c -  Continue low dose SSI  Hypertension blood pressure very elevated -  Continue lisinopril  -  Added hydralazine this morning, but BPs remained 716R-678L systolic  -  Add labetalol  Bilateral leg edema, anasarca from malignancy and malnutrition -  Nutrition consult appreciated -  Duplex lower extremities negative for DVT  Diet:  regular Access:  port IVF:  yes Proph:  lovenox  Code Status: full Family Communication: patient and wife Disposition Plan: pending clinical improvement.  Still very ill.  Will order OOB and PT/OT to slow deconditioning    Consultants:  Oncology at [facility] in [place], Dr. Howie Ill, by phone.  Ph:  [phone].  FAX:  [phone].    Procedures:  CXR  CT head  CT chest/abd/pelvis  Antibiotics:  Vancomycin 2/12 >>  Cefepime 2/13  HPI/Subjective:  Patient confused.  Cannot remember how many children he has.  Tangiential and rambling.    Objective: Filed Vitals:   10/20/13 0100 10/20/13 0633 10/20/13 0934 10/20/13 1429  BP: 162/79 191/93  183/95  Pulse: 90 81  78  Temp:  100.7 F (38.2 C) 98.8 F (37.1 C) 99.5 F (37.5 C)  TempSrc:  Oral  Oral  Resp:  22  20  Height: $Remove'5\' 7"'weijTDI$  (1.702 m)     Weight: 65.7  kg (144 lb 13.5 oz)     SpO2:  100%  93%    Intake/Output Summary (Last 24 hours) at 10/20/13 1649 Last data filed at 10/20/13 1430  Gross per 24 hour  Intake    120 ml  Output    100 ml  Net     20 ml   Filed Weights   10/20/13 0100  Weight: 65.7 kg (144 lb 13.5 oz)    Exam:   General:  Cachectic BM, No acute distress  HEENT:  NCAT, MMM  Cardiovascular:  Tachycardic regular rhythm, nl S1, S2 3/6 systolic murmur LSB, 2+ pulses, warm extremities  Respiratory:  CTAB, no increased WOB  Abdomen:   NABS, soft, large fungating mass central abdomen, hepatomegaly.  No discharge or erythema.  No obvious TTP  MSK:   Decreased tone and bulk, 1+ bilateral LEE  Neuro:  Grossly moves all extremities  Psych:  Oriented to person only.    Data Reviewed: Basic Metabolic Panel:  Recent Labs Lab 10/19/13 2035 10/20/13 0233  NA 133* 132*  K 4.3 4.0  CL 94* 94*  CO2 25 23  GLUCOSE 120* 123*  BUN 15 14  CREATININE 0.87 0.78  CALCIUM 9.3 9.1   Liver Function Tests:  Recent Labs Lab 10/19/13 2035  AST 25  ALT 15  ALKPHOS 148*  BILITOT 0.5  PROT 6.7  ALBUMIN 2.8*    Recent Labs Lab 10/19/13 2035  LIPASE 11    Recent Labs Lab 10/20/13 0233  AMMONIA 21   CBC:  Recent Labs Lab 10/19/13 2035 10/20/13 0233  WBC 5.7 5.8  NEUTROABS 2.9  --   HGB 11.4* 11.7*  HCT 32.8* 33.7*  MCV 91.1 90.6  PLT 109* 100*   Cardiac Enzymes: No results found for this basename: CKTOTAL, CKMB, CKMBINDEX, TROPONINI,  in the last 168 hours BNP (last 3 results) No results found for this basename: PROBNP,  in the last 8760 hours CBG:  Recent Labs Lab 10/19/13 1933 10/20/13 0058 10/20/13 0757 10/20/13 1208  GLUCAP 103* 103* 106* 167*    No results found for this or any previous visit (from the past 240 hour(s)).   Studies: Dg Chest 2 View  10/19/2013   CLINICAL DATA:  Fever on chemotherapy  EXAM: CHEST  2 VIEW  COMPARISON:  None.  FINDINGS: Cardiac shadow is within  normal limits. Nodular changes are noted throughout both lungs likely related to the metastatic disease. No prior films were available for comparison. A right chest wall port is seen in satisfactory position. No focal confluent infiltrate or effusion is seen. The bony structures are grossly unremarkable.  IMPRESSION: Diffuse nodular changes throughout both lungs most consistent with metastatic disease. No focal infiltrate is seen.   Electronically Signed   By: Inez Catalina M.D.   On: 10/19/2013 21:18   Ct Head Wo Contrast  10/19/2013   CLINICAL DATA:  Abnormal mental status. Stage IV colon cancer. Patient directed to the ER after calling primary care physician.  EXAM: CT HEAD WITHOUT CONTRAST  TECHNIQUE: Contiguous axial images were obtained from the base of the skull through the vertex without intravenous contrast.  COMPARISON:  None.  FINDINGS: No mass lesion, mass effect, midline shift, hydrocephalus, hemorrhage. No acute territorial cortical ischemia/infarct. Atrophy and chronic ischemic white matter disease is present. Atrophy is less than expected for age. Small benign-appearing calcification adjacent to the cingulate gyrus. Allowing for limitations of noncontrast technique, there is no evidence of intracranial metastatic disease. Dense intracranial atherosclerosis. The mastoid air cells appear clear. Pansinusitis is present with apparent fluid levels in the maxillary sinuses, suggesting acute sinusitis. Similar changes are present in the right frontal sinus.  IMPRESSION: 1. No acute intracranial abnormality. Chronic ischemic white matter disease. 2. Pansinusitis with scattered fluid levels which can be associated with acute sinusitis in the appropriate clinical setting.   Electronically Signed   By: Dereck Ligas M.D.   On: 10/19/2013 21:20    Scheduled Meds: . ceFEPime (MAXIPIME) IV  1 g Intravenous Q8H  . enoxaparin (LOVENOX) injection  40 mg Subcutaneous Q24H  . feeding supplement (ENSURE  COMPLETE)  237 mL Oral BID BM  . hydrALAZINE  25 mg Oral TID  . insulin aspart  0-9 Units Subcutaneous TID WC  . lisinopril  20 mg Oral Daily  . sodium chloride  500 mL Intravenous Once  . vancomycin  750 mg Intravenous Q12H   Continuous Infusions: . sodium chloride 75 mL/hr at 10/20/13 0056    Active Problems:   Acute encephalopathy   UTI (urinary tract infection)   Metastatic colon cancer to liver   Colon carcinoma metastatic to lung   Anemia   Diabetes mellitus   Hypertension   Protein calorie malnutrition    Time spent: 30 min    Kristalyn Bergstresser, [facility] Pager [phone]. If 7PM-7AM, please contact night-coverage at [email or web address], password [facility] 10/20/2013, 4:49 PM  LOS: 1 day

## 2013-10-21 ENCOUNTER — Inpatient Hospital Stay (HOSPITAL_COMMUNITY): Payer: Medicare Other

## 2013-10-21 DIAGNOSIS — J189 Pneumonia, unspecified organism: Secondary | ICD-10-CM

## 2013-10-21 DIAGNOSIS — R188 Other ascites: Secondary | ICD-10-CM

## 2013-10-21 DIAGNOSIS — R509 Fever, unspecified: Secondary | ICD-10-CM

## 2013-10-21 DIAGNOSIS — R4182 Altered mental status, unspecified: Secondary | ICD-10-CM

## 2013-10-21 LAB — COMPREHENSIVE METABOLIC PANEL
ALBUMIN: 2.3 g/dL — AB (ref 3.5–5.2)
ALK PHOS: 132 U/L — AB (ref 39–117)
ALT: 15 U/L (ref 0–53)
AST: 25 U/L (ref 0–37)
BUN: 13 mg/dL (ref 6–23)
CALCIUM: 8.7 mg/dL (ref 8.4–10.5)
CO2: 22 mEq/L (ref 19–32)
Chloride: 94 mEq/L — ABNORMAL LOW (ref 96–112)
Creatinine, Ser: 0.68 mg/dL (ref 0.50–1.35)
GFR calc Af Amer: >90 mL/min (ref >90)
GFR calc non Af Amer: >90 mL/min (ref >90)
Glucose, Bld: 149 mg/dL — ABNORMAL HIGH (ref 70–99)
POTASSIUM: 3.5 meq/L — AB (ref 3.7–5.3)
Sodium: 130 mEq/L — ABNORMAL LOW (ref 137–147)
TOTAL PROTEIN: 6.2 g/dL (ref 6.0–8.3)
Total Bilirubin: 0.4 mg/dL (ref 0.3–1.2)

## 2013-10-21 LAB — GLUCOSE, CAPILLARY
GLUCOSE-CAPILLARY: 191 mg/dL — AB (ref 70–99)
Glucose-Capillary: 137 mg/dL — ABNORMAL HIGH (ref 70–99)
Glucose-Capillary: 246 mg/dL — ABNORMAL HIGH (ref 70–99)
Glucose-Capillary: 150 mg/dL — ABNORMAL HIGH (ref 70–99)

## 2013-10-21 LAB — CBC WITH DIFFERENTIAL/PLATELET
BASOS PCT: 0 % (ref 0–1)
Basophils Absolute: 0 10*3/uL (ref 0.0–0.1)
EOS ABS: 0 10*3/uL (ref 0.0–0.7)
Eosinophils Relative: 0 % (ref 0–5)
HEMATOCRIT: 32 % — AB (ref 39.0–52.0)
Hemoglobin: 11.2 g/dL — ABNORMAL LOW (ref 13.0–17.0)
Lymphocytes Relative: 33 % (ref 12–46)
Lymphs Abs: 2.3 10*3/uL (ref 0.7–4.0)
MCH: 31.4 pg (ref 26.0–34.0)
MCHC: 35 g/dL (ref 30.0–36.0)
MCV: 89.6 fL (ref 78.0–100.0)
MONO ABS: 0.4 10*3/uL (ref 0.1–1.0)
MONOS PCT: 5 % (ref 3–12)
NEUTROS ABS: 4.4 10*3/uL (ref 1.7–7.7)
Neutrophils Relative %: 62 % (ref 43–77)
Platelets: 112 10*3/uL — ABNORMAL LOW (ref 150–400)
RBC: 3.57 MIL/uL — ABNORMAL LOW (ref 4.22–5.81)
RDW: 14.2 % (ref 11.5–15.5)
WBC: 7.1 10*3/uL (ref 4.0–10.5)

## 2013-10-21 LAB — HEMOGLOBIN A1C
Hgb A1c MFr Bld: 5.8 % — ABNORMAL HIGH (ref <5.7)
MEAN PLASMA GLUCOSE: 120 mg/dL — AB (ref <117)

## 2013-10-21 LAB — LACTIC ACID, PLASMA: LACTIC ACID, VENOUS: 1.5 mmol/L (ref 0.5–2.2)

## 2013-10-21 LAB — URINE CULTURE
COLONY COUNT: NO GROWTH
Culture: NO GROWTH

## 2013-10-21 LAB — LIPASE, BLOOD: Lipase: 15 U/L (ref 11–59)

## 2013-10-21 MED ORDER — LISINOPRIL 40 MG PO TABS
40.0000 mg | ORAL_TABLET | Freq: Every day | ORAL | Status: DC
  Administered 2013-10-21 – 2013-10-23 (×3): 40 mg via ORAL
  Filled 2013-10-21 (×3): qty 1

## 2013-10-21 MED ORDER — HYDRALAZINE HCL 50 MG PO TABS
50.0000 mg | ORAL_TABLET | Freq: Three times a day (TID) | ORAL | Status: DC
  Administered 2013-10-21 – 2013-10-23 (×7): 50 mg via ORAL
  Filled 2013-10-21 (×9): qty 1

## 2013-10-21 MED ORDER — GADOBENATE DIMEGLUMINE 529 MG/ML IV SOLN
13.0000 mL | Freq: Once | INTRAVENOUS | Status: AC | PRN
  Administered 2013-10-21: 13 mL via INTRAVENOUS

## 2013-10-21 MED ORDER — OXYCODONE HCL 5 MG PO TABS
10.0000 mg | ORAL_TABLET | ORAL | Status: DC | PRN
  Administered 2013-10-22: 10 mg via ORAL
  Filled 2013-10-21: qty 2

## 2013-10-21 MED ORDER — METRONIDAZOLE IN NACL 5-0.79 MG/ML-% IV SOLN
500.0000 mg | Freq: Three times a day (TID) | INTRAVENOUS | Status: DC
  Administered 2013-10-21: 500 mg via INTRAVENOUS
  Filled 2013-10-21 (×3): qty 100

## 2013-10-21 MED ORDER — DOCUSATE SODIUM 100 MG PO CAPS
100.0000 mg | ORAL_CAPSULE | Freq: Two times a day (BID) | ORAL | Status: DC
  Administered 2013-10-21 – 2013-10-22 (×3): 100 mg via ORAL
  Filled 2013-10-21 (×4): qty 1

## 2013-10-21 MED ORDER — SENNA 8.6 MG PO TABS
2.0000 | ORAL_TABLET | Freq: Every day | ORAL | Status: DC
  Administered 2013-10-21: 17.2 mg via ORAL
  Filled 2013-10-21: qty 2

## 2013-10-21 MED ORDER — WHITE PETROLATUM GEL
Status: DC | PRN
  Administered 2013-10-21: 1 via TOPICAL
  Filled 2013-10-21: qty 5

## 2013-10-21 MED ORDER — LABETALOL HCL 200 MG PO TABS
200.0000 mg | ORAL_TABLET | Freq: Three times a day (TID) | ORAL | Status: DC
  Administered 2013-10-21 – 2013-10-23 (×7): 200 mg via ORAL
  Filled 2013-10-21 (×9): qty 1

## 2013-10-21 MED ORDER — SALINE SPRAY 0.65 % NA SOLN
1.0000 | NASAL | Status: DC | PRN
  Administered 2013-10-21: 1 via NASAL
  Filled 2013-10-21: qty 44

## 2013-10-21 MED ORDER — OXYMETAZOLINE HCL 0.05 % NA SOLN
2.0000 | NASAL | Status: DC | PRN
  Administered 2013-10-21: 2 via NASAL
  Filled 2013-10-21: qty 15

## 2013-10-21 NOTE — Progress Notes (Signed)
Occupational Therapy Evaluation Patient Details Name: Jonathon Hall MRN: [id number] DOB: 04/09/1946 Today's Date: 10/21/2013 Time: 2836-6294 OT Time Calculation (min): 22 min  OT Assessment / Plan / Recommendation History of present illness 68 year old male with past medical hx of hypertension, diabetes, colon recurrence in 2014 with 10 cc to the liver and the lung and currently on chemotherapy every other week since April 2014. Patient follows with Dr. Lyda Kalata in [facility] and he is here visiting his daughter. Patient has been found to be increasingly sleepy during conversation for4-5 days. Today he was increasingly lethargic and confused. His oncologist had advised him to take oxycodone for his lower abdominal pain as needed and patient reports taking oxycodone 5-6 times a day. Family feels that he may be taking more pills than usual. Patient also was having chills and nasal congestion for last few days.     Clinical Impression   Completed education with pt/family regarding need for minguard during ADL and mobility for ADL due to pt being fall risk due to weakness. Also rec for pt to use 3 in 1 for toileting and shower. Pt given theraband to complete ex on own and rec for sqeeze ball to complete grip strengthening. Family said they would get one for him. Family states they can provide level of assist needed for home D/C. Marland Kitchen Pt with questions regarding pain mgnt. Notified nurse.     OT Assessment  Patient does not need any further OT services    Follow Up Recommendations  No OT follow up;Supervision/Assistance - 24 hour    Barriers to Discharge      Equipment Recommendations  3 in 1 bedside comode    Recommendations for Other Services    Frequency       Precautions / Restrictions Precautions Precautions: Fall   Pertinent Vitals/Pain No c/o pain    ADL  ADL Comments: Pt requires minguard for ADL due to fall risk. discussed home safety with family. rec use of 3 in 1 and minguard  for shower transfers.     OT Diagnosis:    OT Problem List:   OT Treatment Interventions:     OT Goals(Current goals can be found in the care plan section) Acute Rehab OT Goals Patient Stated Goal: Go to daughter's house OT Goal Formulation:  (eval only)  Visit Information  Last OT Received On: 10/21/13 Assistance Needed: +1 History of Present Illness: 68 year old male with past medical hx of hypertension, diabetes, colon recurrence in 2014 with 10 cc to the liver and the lung and currently on chemotherapy every other week since April 2014. Patient follows with Dr. Lyda Kalata in [facility] and he is here visiting his daughter. Patient has been found to be increasingly sleepy during conversation for4-5 days. Today he was increasingly lethargic and confused. His oncologist had advised him to take oxycodone for his lower abdominal pain as needed and patient reports taking oxycodone 5-6 times a day. Family feels that he may be taking more pills than usual. Patient also was having chills and nasal congestion for last few days.         Prior [place] expects to be discharged to:: Private residence (d/c to daughter's house in [place]) Living Arrangements: Spouse/significant other (lives in [place]) Type of Home: House Home Access: Stairs to enter Technical brewer of Steps: 2 [place]: Two level;1/2 bath on main level (daughter's house) Home Equipment: None Additional Comments: Pt will be d/c to daughter's house  in [place] Prior Function Level of Independence: Independent Communication Communication: No difficulties Dominant Hand: Right         Vision/Perception     Cognition  Cognition Arousal/Alertness: Lethargic;Suspect due to medications Behavior During Therapy: Flat affect Overall Cognitive Status: Impaired/Different from baseline Area of Impairment: Attention;Memory;Following  commands;Safety/judgement;Awareness;Problem solving Current Attention Level: Selective Memory: Decreased short-term memory Following Commands: Follows one step commands with increased time Safety/Judgement: Decreased awareness of safety Awareness: Emergent Problem Solving: Slow processing;Requires verbal cues    Extremity/Trunk Assessment Upper Extremity Assessment Upper Extremity Assessment: Generalized weakness;RUE deficits/detail;LUE deficits/detail RUE Deficits / Details: decreased sensation B hands - most likely realted to chemo Lower Extremity Assessment Lower Extremity Assessment: Generalized weakness Cervical / Trunk Assessment Cervical / Trunk Assessment: Normal     Mobility Bed Mobility Overal bed mobility: Needs Assistance Bed Mobility: Supine to Sit;Sit to Supine Supine to sit: Supervision;HOB elevated Sit to supine: Supervision;HOB elevated Transfers Overall transfer level: Needs assistance Equipment used: 1 person hand held assist Transfers: Sit to/from Stand Sit to Stand: Min guard     Exercise Other Exercises Other Exercises: given theraband to work on [place] Overall balance assessment: Needs assistance Sitting-balance support: Feet supported;No upper extremity supported Sitting balance-Leahy Scale: Good Standing balance support: No upper extremity supported Standing balance-Leahy Scale: Fair General Comments General comments (skin integrity, edema, etc.): c/o darkened areas on B hands   End of Session OT - End of Session Equipment Utilized During Treatment: Gait belt Activity Tolerance: Patient tolerated treatment well Patient left: in bed;with call bell/phone within reach;with family/visitor present Nurse Communication: Mobility status;Other (comment) (pt with questions regading pain management)  GO     Shanyia Stines,HILLARY 10/21/2013, 4:05 PM [facility], OTR/L  [phone] 10/21/2013

## 2013-10-21 NOTE — Evaluation (Signed)
Physical Therapy Evaluation Patient Details Name: Jonathon Hall MRN: [id number] DOB: 22-Mar-1946 Today's Date: 10/21/2013 Time: 0918-1000 PT Time Calculation (min): 42 min  PT Assessment / Plan / Recommendation History of Present Illness  68 year old male with past medical hx of hypertension, diabetes, colon recurrence in 2014 with 10 cc to the liver and the lung and currently on chemotherapy every other week since April 2014. Patient follows with Dr. Lyda Kalata in [facility] and he is here visiting his daughter. Patient has been found to be increasingly sleepy during conversation for4-5 days. Today he was increasingly lethargic and confused. His oncologist had advised him to take oxycodone for his lower abdominal pain as needed and patient reports taking oxycodone 5-6 times a day. Family feels that he may be taking more pills than usual. Patient also was having chills and nasal congestion for last few days.    Clinical Impression  Pt admitted with above. Pt currently with functional limitations due to the deficits listed below (see PT Problem List).  Pt will benefit from skilled PT to increase their independence and safety with mobility to allow discharge to the venue listed below. Will d/c to daughter's house in [place] as he and wife live in [place].. Wife unable to physically assist pt.     PT Assessment  Patient needs continued PT services    Follow Up Recommendations  Home health PT    Does the patient have the potential to tolerate intense rehabilitation      Barriers to Discharge Inaccessible home environment flight of stairs to get to bedroom at daughter's house in [place].  Pt lives in [place].    Equipment Recommendations  Rolling walker with 5" wheels    Recommendations for Other Services     Frequency Min 3X/week    Precautions / Restrictions     Pertinent Vitals/Pain Nausea, no c/o pain      Mobility  Bed Mobility Overal bed mobility: Needs  Assistance Bed Mobility: Supine to Sit Supine to sit: Min guard;HOB elevated General bed mobility comments: HOB elevated with heavy verbal cueing Transfers Overall transfer level: Needs assistance Transfers: Sit to/from Stand Sit to Stand: Min assist General transfer comment: Pt moves slowly. sit to stand from bed and BSC. Ambulation/Gait Ambulation/Gait assistance: Min assist Ambulation Distance (Feet): 100 Feet (1 standing rest break) Assistive device: Rolling walker (2 wheeled) Gait Pattern/deviations: Step-through pattern;Trunk flexed Gait velocity: slow General Gait Details: Pt with guarded gait and slow cadence.  Amb in room without ad to bathroom with MIN A and decreased balance and 1 small LOB.    Exercises     PT Diagnosis: Difficulty walking;Generalized weakness  PT Problem List: Decreased strength;Decreased balance;Decreased activity tolerance;Decreased mobility PT Treatment Interventions: Gait training;Stair training;Functional mobility training;Therapeutic activities;Therapeutic exercise     PT Goals(Current goals can be found in the care plan section) Acute Rehab PT Goals Patient Stated Goal: Go to daughter's house PT Goal Formulation: With patient/family Time For Goal Achievement: 11/04/13 Potential to Achieve Goals: Good  Visit Information  Last PT Received On: 10/21/13 Assistance Needed: +1 History of Present Illness: 68 year old male with past medical hx of hypertension, diabetes, colon recurrence in 2014 with 10 cc to the liver and the lung and currently on chemotherapy every other week since April 2014. Patient follows with Dr. Lyda Kalata in [place] and he is here visiting his daughter. Patient has been found to be increasingly sleepy during conversation for4-5 days. Today he was increasingly lethargic and confused. His oncologist  had advised him to take oxycodone for his lower abdominal pain as needed and patient reports taking oxycodone 5-6 times a day.  Family feels that he may be taking more pills than usual. Patient also was having chills and nasal congestion for last few days.         Prior [place] expects to be discharged to:: Private residence (d/c to daughter's house in [place]) Living Arrangements: Spouse/significant other (lives in [place]) Type of Home: House Home Access: Stairs to enter Technical brewer of Steps: 2 [place]: Two level;1/2 bath on main level (daughter's house) Home Equipment: None Additional Comments: Pt will be d/c to daughter's house in [place] Prior Function Level of Independence: Independent    Cognition  Cognition Arousal/Alertness: Lethargic Behavior During Therapy: Flat affect Overall Cognitive Status: Within Functional Limits for tasks assessed Memory: Decreased short-term memory    Extremity/Trunk Assessment Upper Extremity Assessment Upper Extremity Assessment: Defer to OT evaluation Lower Extremity Assessment Lower Extremity Assessment: Generalized weakness Cervical / Trunk Assessment Cervical / Trunk Assessment: Normal   Balance Balance Overall balance assessment: Needs assistance Standing balance-Leahy Scale: Fair General Comments General comments (skin integrity, edema, etc.): Pt with bloody nose after toileting and before gait.  Nursing aware.  End of Session PT - End of Session Equipment Utilized During Treatment: Gait belt Activity Tolerance: Patient limited by lethargy Patient left: in chair;with family/visitor present Nurse Communication: Mobility status  GP     Sanaz Scarlett LUBECK 10/21/2013, 10:13 AM

## 2013-10-21 NOTE — Progress Notes (Addendum)
[facility] PROGRESS NOTE  Jonathon Hall JYN:[id number] DOB: [birth year] DOA: 10/19/2013 PCP: No primary provider on file.  Assessment/Plan  Acute encephalopathy, may be due to overuse of narcotic or delirium from ongoing fevers due to infection or progressive malignancy.  Still confused but improved today -  Minimize sedating medications -  CT head neg -  MRI brain  Sepsis due to HCAP, less likely due to intraabdominal fluid collections, WBC count currently normal, however, patient receiving ongoing chemotherapy.  Fevers trending down with antibiotics.  May have some fever secondary to metastatic cancer also.   -  Continue vancomycin and cefepime -  CT angio chest:  Neg for PE, possible superimposed multifocal pneumonia vs. 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  Started on FOLFOX but had progression so changed to FOLFIRI and Avastin summer 2014.  Continues to have progression.  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  All treatments are palliative.  Per last clinic note 10/12/2013, "An open and frank discussion was held separately with [patient's] son regarding realistic expectations from treatment and prognosis." Dr. Golden Pop, primary oncologist.  Discussed case with Dr. Howie Ill, one of Dr. Lynnell Jude partners as he is out of town for the next week, who stated that if [name] not recovering well from this current insult, based on the progression of cancer and  Dr. Lynnell Jude notes, she would support hospice care/palliative care.   -  Palliative care consult to discuss goals of care  Anemia of chronic disease, hemoglobin stable, no need for transfusion  Thrombocytopenia, mild and stable, may be due to chemo/malignancy   Diabetes mellitus CBG well controlled -  A1c 5.8 -  Continue low dose SSI  Hypertension blood pressure very elevated -  Continue lisinopril  -  Increase hydralazine and labetalol  Bilateral leg edema, anasarca from malignancy and malnutrition -  Nutrition consult appreciated -  Duplex lower extremities negative for DVT  Diet:  regular Access:  port IVF:  off Proph:  lovenox  Code Status: full Family Communication: patient and wife Disposition Plan:  If continuing to improve, possibly home tomorrow on oral FQ for HCAP   Consultants:  Oncology at [facility] in [place], Dr. Howie Ill, by phone.  Ph:  [phone].  FAX:  [phone].    Procedures:  CXR  CT head  CT chest/abd/pelvis  Antibiotics:  Vancomycin 2/12 >>  Cefepime 2/13   HPI/Subjective:  Still tangential, but improved mentation today.  Nausea this morning after breakfast and almost vomited  Objective: Filed Vitals:   10/20/13 2121 10/20/13 2236 10/21/13 0011 10/21/13 0553  BP: 170/88 187/98 155/81 172/91  Pulse:  86 86 69  Temp:  98.7 F (37.1 C)  99.2 F (37.3 C)  TempSrc:  Oral  Oral  Resp:  20  18  Height:      Weight:      SpO2:  98%  98%    Intake/Output Summary (Last 24 hours) at 10/21/13 1113 Last data filed at 10/21/13 0704  Gross per 24 hour  Intake   2830 ml  Output      0 ml  Net   2830 ml   Filed Weights   10/20/13 0100  Weight: 65.7 kg (144 lb 13.5 oz)    Exam:   General:  Cachectic BM, No acute distress  HEENT:  NCAT, MMM  Cardiovascular:  Tachycardic regular rhythm, nl S1, S2 3/6 systolic murmur LSB, 2+ pulses, warm extremities  Respiratory:  Rales at the right base, no rhonchi or  wheeze, no increased WOB  Abdomen:   NABS, soft, large fungating mass central abdomen, hepatomegaly.  No discharge or erythema.  No obvious TTP  MSK:   Decreased tone and bulk, 1+ bilateral LEE  Neuro:  Grossly moves all extremities   Data Reviewed: Basic Metabolic Panel:  Recent Labs Lab 10/19/13 2035 10/20/13 0233 10/21/13 0616  NA 133* 132* 130*  K 4.3 4.0 3.5*  CL 94* 94* 94*  CO2 '25 23 22  ' GLUCOSE 120* 123* 149*  BUN '15 14 13  ' CREATININE 0.87 0.78 0.68  CALCIUM 9.3 9.1 8.7   Liver Function Tests:  Recent Labs Lab 10/19/13 2035 10/21/13 0616  AST 25 25  ALT 15 15  ALKPHOS 148* 132*  BILITOT 0.5 0.4  PROT 6.7 6.2  ALBUMIN 2.8* 2.3*    Recent Labs Lab 10/19/13 2035 10/21/13 0616  LIPASE 11 15    Recent Labs Lab 10/20/13 0233  AMMONIA 21   CBC:  Recent Labs Lab 10/19/13 2035 10/20/13 0233 10/21/13 0616  WBC 5.7 5.8 7.1  NEUTROABS 2.9  --  4.4  HGB 11.4* 11.7* 11.2*  HCT 32.8* 33.7* 32.0*  MCV 91.1 90.6 89.6  PLT 109* 100* 112*   Cardiac Enzymes: No results found for this basename: CKTOTAL, CKMB, CKMBINDEX, TROPONINI,  in the last 168 hours BNP (last 3 results) No results found for this basename: PROBNP,  in the last 8760 hours CBG:  Recent Labs Lab 10/20/13 0058 10/20/13 0757 10/20/13 1208 10/20/13 2234 10/21/13 0806  GLUCAP 103* 106* 167* 129* 137*    Recent Results (from the past 240 hour(s))  URINE CULTURE     Status: None   Collection Time    10/20/13  1:00 AM      Result Value Ref Range Status   Specimen Description URINE, CLEAN CATCH   Final   Special Requests NONE   Final   Culture  Setup Time     Final   Value: 10/20/2013 04:46     Performed at [facility]     Final   Value: NO GROWTH     Performed at [facility]   Culture     Final   Value: NO GROWTH     Performed at [facility]   Report Status 10/21/2013 FINAL   Final     Studies: Dg Chest 2 View  10/19/2013    CLINICAL DATA:  Fever on chemotherapy  EXAM: CHEST  2 VIEW  COMPARISON:  None.  FINDINGS: Cardiac shadow is within normal limits. Nodular changes are noted throughout both lungs likely related to the metastatic disease. No prior films were available for comparison. A right chest wall port is seen in satisfactory position. No focal confluent infiltrate or effusion is seen. The bony structures are grossly unremarkable.  IMPRESSION: Diffuse nodular changes throughout both lungs most consistent with metastatic disease. No focal infiltrate is seen.   Electronically Signed   By: Inez Catalina M.D.   On:  10/19/2013 21:18   Ct Head Wo Contrast  10/19/2013   CLINICAL DATA:  Abnormal mental status. Stage IV colon cancer. Patient directed to the ER after calling primary care physician.  EXAM: CT HEAD WITHOUT CONTRAST  TECHNIQUE: Contiguous axial images were obtained from the base of the skull through the vertex without intravenous contrast.  COMPARISON:  None.  FINDINGS: No mass lesion, mass effect, midline shift, hydrocephalus, hemorrhage. No acute territorial cortical ischemia/infarct. Atrophy and chronic ischemic white matter disease is present. Atrophy is less than expected for age. Small benign-appearing calcification adjacent to the cingulate gyrus. Allowing for limitations of noncontrast technique, there is no evidence of intracranial metastatic disease. Dense intracranial atherosclerosis. The mastoid air cells appear clear. Pansinusitis is present with apparent fluid levels in the maxillary sinuses, suggesting acute sinusitis. Similar changes are present in the right frontal sinus.  IMPRESSION: 1. No acute intracranial abnormality. Chronic ischemic white matter disease. 2. Pansinusitis with scattered fluid levels which can be associated with acute sinusitis in the appropriate clinical setting.   Electronically Signed   By: Dereck Ligas M.D.   On: 10/19/2013 21:20   Ct Angio Chest Pe W/cm &/or Wo Cm  10/20/2013    CLINICAL DATA:  Metastatic cancer, persistent tachypnea and shortness of breath  EXAM: CT ANGIOGRAPHY CHEST WITH CONTRAST  TECHNIQUE: Multidetector CT imaging of the chest was performed using the standard protocol during bolus administration of intravenous contrast. Multiplanar CT image reconstructions and MIPs were obtained to evaluate the vascular anatomy.  CONTRAST:  127m OMNIPAQUE IOHEXOL 350 MG/ML SOLN  COMPARISON:  Chest x-ray 10/19/2013  FINDINGS: Mediastinum: Heterogeneous and multinodular thyroid gland. There is a 1.9 cm hypoechoic nodule with dystrophic peripheral calcifications in the inferior thyroid isthmus and a 1.3 cm low-attenuation nodule in the inferior left thyroid gland. Low right paratracheal lymph node is borderline enlarged at 10 mm in [place] axis. Unremarkable visualized thoracic esophagus.  Heart/Vascular: Is excellent opacification of the pulmonary arteries to the proximal subsegmental level. No central filling defect to suggest acute pulmonary embolus. The main and central pulmonary arteries are within normal limits for size. The heart is within normal limits for size. Atherosclerotic calcifications are noted throughout the coronary arteries. No pericardial effusion. Mild ectasia of the ascending thoracic aorta at 4.2 cm. There is some calcification and slight thickening of the aortic valve leaflets. A right IJ approach port a catheter is present. The catheter tip is noted within the superior cavoatrial junction.  Lungs/Pleura: Innumerable pulmonary nodules in a random distribution most consistent with hematogenous metastases. Nodules are noted throughout all lobes of both lungs but there is a deep tendon predominance which is also consistent with hematogenous distribution the largest single nodules identified in the right lower lobe and measures 1.7 cm in maximal diameter. Additional patchy ground-glass attenuation opacities are noted in a peribronchovascular distribution in the  bilateral lower lobes concerning for multi focal pneumonia versus aspiration. No pneumothorax and no evidence of pleural effusion or pulmonary edema.  Bones/Soft Tissues: No acute fracture or aggressive appearing lytic or blastic osseous lesion. Enlarged left axillary lymph nodes maximal size 3.3 x 2.6 cm. Diffuse skin thickening and reticulation of the subcutaneous fat consistent with anasarca. Prominent right axillary lymph node also enlarged at 1.8 x 2.2 cm.  Upper Abdomen: Incompletely evaluated innumerable hepatic lesions throughout both the left and right hepatic lobes. The largest definitive lesion measures approximately 5.6 cm in diameter in the mid right hepatic lobe.  Review of the MIP images confirms  the above findings.  IMPRESSION: 1. Negative for acute pulmonary embolus 2. Patchy ground-glass attenuation airspace opacity in a bronchovascular distribution in the bilateral lower lobes concerning for multifocal pneumonia or aspiration. 3. CT findings are consistent with widespread metastatic disease with innumerable bilateral pulmonary nodules, bilateral axillary adenopathy and innumerable hepatic lesions. 4. 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 5. Thyroid nodules are nonspecific by CT scan. If clinically warranted, dedicated thyroid ultrasound could further evaluate. This may be of limited clinical utility given the patient's comorbidities. 6. Thickening and calcification of the aortic valve with ectasia of the ascending thoracic aorta which measures up to 4.2 cm in diameter. There may be underlying aortic stenosis. Echocardiography could further evaluate if clinically warranted.   Electronically Signed   By: Jacqulynn Cadet M.D.   On: 10/20/2013 18:20   Ct Abdomen Pelvis W Contrast  10/21/2013   CLINICAL DATA:  High fevers with unclear source. Evaluate for occult intra-abdominal abscess. Metastatic colon cancer.  EXAM: CT ABDOMEN AND PELVIS WITH CONTRAST  TECHNIQUE:  Multidetector CT imaging of the abdomen and pelvis was performed using the standard protocol following bolus administration of intravenous contrast.  CONTRAST:  57m OMNIPAQUE IOHEXOL 300 MG/ML  SOLN  COMPARISON:  None.  FINDINGS: As described on the chest CT from yesterday, there are innumerable pulmonary nodules within the lung bases.  There are innumerable hypoenhancing lesions throughout both lobes of the liver. A conglomeration of lesions in the inferior right hepatic lobe measures approximately 11.9 x 6.7 cm. Punctate gallstones are questioned. The spleen, adrenal glands, left kidney, and pancreas have an unremarkable enhanced appearance. Sub 5 mm hypodense lesions in the right kidney are too small to characterize.  Rounded, low-density structures in the porta hepatis measure up to 3.7 x 3.2 cm in size and compress the main portal vein and may reflect low-density porta hepatis lymph nodes. The portal vein appears patent without definite thrombus identified. Loculated fluid extending inferiorly from the porta hepatis measures 7.4 x 6.5 cm without definite rim enhancement. Small amount of partially loculated fluid in the right paracolic gutter measures 4.5 x 2.5 cm and may demonstrate minimal, incomplete rim enhancement (series 2, image 41). A small amount of free fluid is present in the left paracolic gutter and in the pelvis.  There is no evidence of bowel obstruction. Lobulated mass involving the skin at the superior margin of the surgical incision in the mid abdominal midline measures 4.2 x 2.2 cm. Rounded mass involving the anterior abdominal wall more inferiorly right of midline measures 3.9 x 3.6 cm. Additional areas of soft tissue nodularity are present in the lower anterior abdominal wall with involvement of the left rectus musculature (image 47). Enlarged left external iliac lymph node measures 3.5 x 2.6 cm. Left inguinal lymph node measures 2.4 x 1.5 cm.  The prostate is enlarged, measuring 5.8 cm in  diameter, with multiple coarse calcifications. Excreted IV contrast is present in the renal collecting systems and bladder. Moderate atherosclerotic calcification is noted involving the abdominal aorta and iliac arteries. There is mild stranding throughout the abdominal and pelvic mesentery. There is mild to moderate anasarca. Heterogeneous appearance of the bones with areas of ill-defined sclerosis, particularly involving the upper sacrum, are nonspecific but concerning for metastases. Moderate lower lumbar facet arthrosis is present.  IMPRESSION: 1. Extensive metastatic disease involving the visualized lung bases and liver. There is pelvic and inguinal lymphadenopathy on the left, and multiple soft tissue masses involving the anterior abdominal wall. Bulky porta hepatis  lymphadenopathy compresses the portal vein, which remains patent. 2. Loculated fluid collection in the right mid abdomen. No definite rim enhancement is identified, however infection is not excluded. There is also a small amount of fluid in the paracolic gutters, with fluid in the right paracolic gutter appearing partially loculated with possible minimal rim enhancement.   Electronically Signed   By: Logan Bores   On: 10/21/2013 03:02    Scheduled Meds: . ceFEPime (MAXIPIME) IV  1 g Intravenous Q8H  . enoxaparin (LOVENOX) injection  40 mg Subcutaneous Q24H  . feeding supplement (ENSURE COMPLETE)  237 mL Oral BID BM  . hydrALAZINE  50 mg Oral TID  . insulin aspart  0-9 Units Subcutaneous TID WC  . labetalol  200 mg Oral TID  . lisinopril  40 mg Oral Daily  . metronidazole  500 mg Intravenous Q8H  . sodium chloride  500 mL Intravenous Once  . vancomycin  750 mg Intravenous Q12H   Continuous Infusions: . sodium chloride 75 mL/hr at 10/21/13 0008    Active Problems:   Acute encephalopathy   UTI (urinary tract infection)   Metastatic colon cancer to liver   Colon carcinoma metastatic to lung   Anemia   Diabetes mellitus    Hypertension   Protein calorie malnutrition   SIRS (systemic inflammatory response syndrome)    Time spent: 30 min    Aasia Peavler, [facility] Pager [phone]. If 7PM-7AM, please contact night-coverage at [email or web address], password [facility] 10/21/2013, 11:13 AM  LOS: 2 days

## 2013-10-21 NOTE — Consult Note (Signed)
Reason for Consult: mental status changes Referring Physician: Dr Jearld Adjutant  Kamarion Zagami is an 68 y.o. male.  HPI: Pt with metastatic colon cancer on treatment.  Colon recurrence in 2014, on chemotherapy every other week since April 2014. Patient follows with Dr. Lyda Kalata in [facility] and he is here visiting his daughter. Patient has been found to be increasingly sleepy during conversation for the last 4-5 days.  He was admitted due to increasing lethargy and confusion. Patient reports taking oxycodone 5-6 times a day. Family feels that he may be taking more pills than usual. Patient also was having chills and nasal congestion for last few days.  He denies abd pain, nausea, vomiting, constipation and diarrhea.     Past Medical History  Diagnosis Date  . Cancer     colon cancer  . Hypertension   . Diabetes mellitus without complication     History reviewed. No pertinent past surgical history.  No family history on file.  Social History:  reports that he has never smoked. He does not have any smokeless tobacco history on file. He reports that he does not drink alcohol. His drug history is not on file.  Allergies: No Known Allergies  Medications: I have reviewed the patient's current medications.  Results for orders placed during the hospital encounter of 10/19/13 (from the past 48 hour(s))  GLUCOSE, CAPILLARY     Status: Abnormal   Collection Time    10/19/13  7:33 PM      Result Value Ref Range   Glucose-Capillary 103 (*) 70 - 99 mg/dL  CBC WITH DIFFERENTIAL     Status: Abnormal   Collection Time    10/19/13  8:35 PM      Result Value Ref Range   WBC 5.7  4.0 - 10.5 K/uL   RBC 3.60 (*) 4.22 - 5.81 MIL/uL   Hemoglobin 11.4 (*) 13.0 - 17.0 g/dL   HCT 32.8 (*) 39.0 - 52.0 %   MCV 91.1  78.0 - 100.0 fL   MCH 31.7  26.0 - 34.0 pg   MCHC 34.8  30.0 - 36.0 g/dL   RDW 14.0  11.5 - 15.5 %   Platelets 109 (*) 150 - 400 K/uL   Comment: REPEATED TO VERIFY     SPECIMEN CHECKED FOR  CLOTS     PLATELET COUNT CONFIRMED BY SMEAR   Neutrophils Relative % 50  43 - 77 %   Lymphocytes Relative 48 (*) 12 - 46 %   Monocytes Relative 2 (*) 3 - 12 %   Eosinophils Relative 0  0 - 5 %   Basophils Relative 0  0 - 1 %   Neutro Abs 2.9  1.7 - 7.7 K/uL   Lymphs Abs 2.7  0.7 - 4.0 K/uL   Monocytes Absolute 0.1  0.1 - 1.0 K/uL   Eosinophils Absolute 0.0  0.0 - 0.7 K/uL   Basophils Absolute 0.0  0.0 - 0.1 K/uL   RBC Morphology OVAL MACROCYTES     Comment: TEARDROP CELLS   WBC Morphology ATYPICAL LYMPHOCYTES     Comment: MODERATE LEFT SHIFT (>5% METAS AND MYELOS,OCC PRO NOTED)  BASIC METABOLIC PANEL     Status: Abnormal   Collection Time    10/19/13  8:35 PM      Result Value Ref Range   Sodium 133 (*) 137 - 147 mEq/L   Potassium 4.3  3.7 - 5.3 mEq/L   Chloride 94 (*) 96 - 112 mEq/L   CO2  25  19 - 32 mEq/L   Glucose, Bld 120 (*) 70 - 99 mg/dL   BUN 15  6 - 23 mg/dL   Creatinine, Ser 0.87  0.50 - 1.35 mg/dL   Calcium 9.3  8.4 - 10.5 mg/dL   GFR calc non Af Amer 86 (*) >90 mL/min   GFR calc Af Amer >90  >90 mL/min   Comment: (NOTE)     The eGFR has been calculated using the CKD EPI equation.     This calculation has not been validated in all clinical situations.     eGFR's persistently <90 mL/min signify possible Chronic Kidney     Disease.  HEPATIC FUNCTION PANEL     Status: Abnormal   Collection Time    10/19/13  8:35 PM      Result Value Ref Range   Total Protein 6.7  6.0 - 8.3 g/dL   Albumin 2.8 (*) 3.5 - 5.2 g/dL   AST 25  0 - 37 U/L   ALT 15  0 - 53 U/L   Alkaline Phosphatase 148 (*) 39 - 117 U/L   Total Bilirubin 0.5  0.3 - 1.2 mg/dL   Bilirubin, Direct 0.2  0.0 - 0.3 mg/dL   Indirect Bilirubin 0.3  0.3 - 0.9 mg/dL  LIPASE, BLOOD     Status: None   Collection Time    10/19/13  8:35 PM      Result Value Ref Range   Lipase 11  11 - 59 U/L  POCT I-STAT TROPONIN I     Status: None   Collection Time    10/19/13  8:45 PM      Result Value Ref Range   Troponin  i, poc 0.01  0.00 - 0.08 ng/mL   Comment 3            Comment: Due to the release kinetics of cTnI,     a negative result within the first hours     of the onset of symptoms does not rule out     myocardial infarction with certainty.     If myocardial infarction is still suspected,     repeat the test at appropriate intervals.  URINALYSIS, ROUTINE W REFLEX MICROSCOPIC     Status: Abnormal   Collection Time    10/19/13  8:58 PM      Result Value Ref Range   Color, Urine AMBER (*) YELLOW   Comment: BIOCHEMICALS MAY BE AFFECTED BY COLOR   APPearance CLEAR  CLEAR   Specific Gravity, Urine 1.023  1.005 - 1.030   pH 7.5  5.0 - 8.0   Glucose, UA NEGATIVE  NEGATIVE mg/dL   Hgb urine dipstick NEGATIVE  NEGATIVE   Bilirubin Urine SMALL (*) NEGATIVE   Ketones, ur NEGATIVE  NEGATIVE mg/dL   Protein, ur 30 (*) NEGATIVE mg/dL   Urobilinogen, UA 4.0 (*) 0.0 - 1.0 mg/dL   Nitrite NEGATIVE  NEGATIVE   Leukocytes, UA TRACE (*) NEGATIVE  URINE MICROSCOPIC-ADD ON     Status: Abnormal   Collection Time    10/19/13  8:58 PM      Result Value Ref Range   Squamous Epithelial / LPF RARE  RARE   WBC, UA 3-6  <3 WBC/hpf   Bacteria, UA RARE  RARE   Casts HYALINE CASTS (*) NEGATIVE   Urine-Other MUCOUS PRESENT    INFLUENZA PANEL BY PCR (TYPE A & B, H1N1)     Status: None   Collection Time  10/20/13 12:21 AM      Result Value Ref Range   Influenza A By PCR NEGATIVE  NEGATIVE   Influenza B By PCR NEGATIVE  NEGATIVE   H1N1 flu by pcr NOT DETECTED  NOT DETECTED   Comment:            The Xpert Flu assay (FDA approved for     nasal aspirates or washes and     nasopharyngeal swab specimens), is     intended as an aid in the diagnosis of     influenza and should not be used as     a sole basis for treatment.     Performed at [facility], CAPILLARY     Status: Abnormal   Collection Time    10/20/13 12:58 AM      Result Value Ref Range   Glucose-Capillary 103 (*) 70 - 99 mg/dL    Comment 1 Notify RN    URINE CULTURE     Status: None   Collection Time    10/20/13  1:00 AM      Result Value Ref Range   Specimen Description URINE, CLEAN CATCH     Special Requests NONE     Culture  Setup Time       Value: 10/20/2013 04:46     Performed at [facility]       Value: NO GROWTH     Performed at [facility]   Culture       Value: NO GROWTH     Performed at [facility]   Report Status 10/21/2013 FINAL    AMMONIA     Status: None   Collection Time    10/20/13  2:33 AM      Result Value Ref Range   Ammonia 21  11 - 60 umol/L  CBC     Status: Abnormal   Collection Time    10/20/13  2:33 AM      Result Value Ref Range   WBC 5.8  4.0 - 10.5 K/uL   RBC 3.72 (*) 4.22 - 5.81 MIL/uL   Hemoglobin 11.7 (*) 13.0 - 17.0 g/dL   HCT 33.7 (*) 39.0 - 52.0 %   MCV 90.6  78.0 - 100.0 fL   MCH 31.5  26.0 - 34.0 pg   MCHC 34.7  30.0 - 36.0 g/dL   RDW 13.9  11.5 - 15.5 %   Platelets 100 (*) 150 - 400 K/uL   Comment: REPEATED TO VERIFY     SPECIMEN CHECKED FOR CLOTS     PLATELET COUNT CONFIRMED BY SMEAR  BASIC METABOLIC PANEL     Status: Abnormal   Collection Time    10/20/13  2:33 AM      Result Value Ref Range   Sodium 132 (*) 137 - 147 mEq/L   Potassium 4.0  3.7 - 5.3 mEq/L   Chloride 94 (*) 96 - 112 mEq/L   CO2 23  19 - 32 mEq/L   Glucose, Bld 123 (*) 70 - 99 mg/dL   BUN 14  6 - 23 mg/dL   Creatinine, Ser 0.78  0.50 - 1.35 mg/dL   Calcium 9.1  8.4 - 10.5 mg/dL   GFR calc non Af Amer >90  >90 mL/min   GFR calc Af Amer >90  >90 mL/min   Comment: (NOTE)     The eGFR has been calculated using the CKD EPI equation.  This calculation has not been validated in all clinical situations.     eGFR's persistently <90 mL/min signify possible Chronic Kidney     Disease.  GLUCOSE, CAPILLARY     Status: Abnormal   Collection Time    10/20/13  7:57 AM      Result Value Ref Range   Glucose-Capillary 106 (*) 70 - 99 mg/dL   Comment 1  Notify RN    GLUCOSE, CAPILLARY     Status: Abnormal   Collection Time    10/20/13 12:08 PM      Result Value Ref Range   Glucose-Capillary 167 (*) 70 - 99 mg/dL   Comment 1 Notify RN    GLUCOSE, CAPILLARY     Status: Abnormal   Collection Time    10/20/13 10:34 PM      Result Value Ref Range   Glucose-Capillary 129 (*) 70 - 99 mg/dL   Comment 1 Notify RN    LACTIC ACID, PLASMA     Status: None   Collection Time    10/21/13  6:16 AM      Result Value Ref Range   Lactic Acid, Venous 1.5  0.5 - 2.2 mmol/L  COMPREHENSIVE METABOLIC PANEL     Status: Abnormal   Collection Time    10/21/13  6:16 AM      Result Value Ref Range   Sodium 130 (*) 137 - 147 mEq/L   Potassium 3.5 (*) 3.7 - 5.3 mEq/L   Chloride 94 (*) 96 - 112 mEq/L   CO2 22  19 - 32 mEq/L   Glucose, Bld 149 (*) 70 - 99 mg/dL   BUN 13  6 - 23 mg/dL   Creatinine, Ser 0.68  0.50 - 1.35 mg/dL   Calcium 8.7  8.4 - 10.5 mg/dL   Total Protein 6.2  6.0 - 8.3 g/dL   Albumin 2.3 (*) 3.5 - 5.2 g/dL   AST 25  0 - 37 U/L   ALT 15  0 - 53 U/L   Alkaline Phosphatase 132 (*) 39 - 117 U/L   Total Bilirubin 0.4  0.3 - 1.2 mg/dL   GFR calc non Af Amer >90  >90 mL/min   GFR calc Af Amer >90  >90 mL/min   Comment: (NOTE)     The eGFR has been calculated using the CKD EPI equation.     This calculation has not been validated in all clinical situations.     eGFR's persistently <90 mL/min signify possible Chronic Kidney     Disease.  LIPASE, BLOOD     Status: None   Collection Time    10/21/13  6:16 AM      Result Value Ref Range   Lipase 15  11 - 59 U/L  CBC WITH DIFFERENTIAL     Status: Abnormal   Collection Time    10/21/13  6:16 AM      Result Value Ref Range   WBC 7.1  4.0 - 10.5 K/uL   RBC 3.57 (*) 4.22 - 5.81 MIL/uL   Hemoglobin 11.2 (*) 13.0 - 17.0 g/dL   HCT 32.0 (*) 39.0 - 52.0 %   MCV 89.6  78.0 - 100.0 fL   MCH 31.4  26.0 - 34.0 pg   MCHC 35.0  30.0 - 36.0 g/dL   RDW 14.2  11.5 - 15.5 %   Platelets 112 (*) 150 -  400 K/uL   Comment: CONSISTENT WITH PREVIOUS RESULT   Neutrophils Relative % 62  43 - 77 %   Neutro Abs 4.4  1.7 - 7.7 K/uL   Lymphocytes Relative 33  12 - 46 %   Lymphs Abs 2.3  0.7 - 4.0 K/uL   Monocytes Relative 5  3 - 12 %   Monocytes Absolute 0.4  0.1 - 1.0 K/uL   Eosinophils Relative 0  0 - 5 %   Eosinophils Absolute 0.0  0.0 - 0.7 K/uL   Basophils Relative 0  0 - 1 %   Basophils Absolute 0.0  0.0 - 0.1 K/uL  GLUCOSE, CAPILLARY     Status: Abnormal   Collection Time    10/21/13  8:06 AM      Result Value Ref Range   Glucose-Capillary 137 (*) 70 - 99 mg/dL   Comment 1 Notify RN      Dg Chest 2 View  10/19/2013   CLINICAL DATA:  Fever on chemotherapy  EXAM: CHEST  2 VIEW  COMPARISON:  None.  FINDINGS: Cardiac shadow is within normal limits. Nodular changes are noted throughout both lungs likely related to the metastatic disease. No prior films were available for comparison. A right chest wall port is seen in satisfactory position. No focal confluent infiltrate or effusion is seen. The bony structures are grossly unremarkable.  IMPRESSION: Diffuse nodular changes throughout both lungs most consistent with metastatic disease. No focal infiltrate is seen.   Electronically Signed   By: Inez Catalina M.D.   On: 10/19/2013 21:18   Ct Head Wo Contrast  10/19/2013   CLINICAL DATA:  Abnormal mental status. Stage IV colon cancer. Patient directed to the ER after calling primary care physician.  EXAM: CT HEAD WITHOUT CONTRAST  TECHNIQUE: Contiguous axial images were obtained from the base of the skull through the vertex without intravenous contrast.  COMPARISON:  None.  FINDINGS: No mass lesion, mass effect, midline shift, hydrocephalus, hemorrhage. No acute territorial cortical ischemia/infarct. Atrophy and chronic ischemic white matter disease is present. Atrophy is less than expected for age. Small benign-appearing calcification adjacent to the cingulate gyrus. Allowing for limitations of  noncontrast technique, there is no evidence of intracranial metastatic disease. Dense intracranial atherosclerosis. The mastoid air cells appear clear. Pansinusitis is present with apparent fluid levels in the maxillary sinuses, suggesting acute sinusitis. Similar changes are present in the right frontal sinus.  IMPRESSION: 1. No acute intracranial abnormality. Chronic ischemic white matter disease. 2. Pansinusitis with scattered fluid levels which can be associated with acute sinusitis in the appropriate clinical setting.   Electronically Signed   By: Dereck Ligas M.D.   On: 10/19/2013 21:20   Ct Angio Chest Pe W/cm &/or Wo Cm  10/20/2013   CLINICAL DATA:  Metastatic cancer, persistent tachypnea and shortness of breath  EXAM: CT ANGIOGRAPHY CHEST WITH CONTRAST  TECHNIQUE: Multidetector CT imaging of the chest was performed using the standard protocol during bolus administration of intravenous contrast. Multiplanar CT image reconstructions and MIPs were obtained to evaluate the vascular anatomy.  CONTRAST:  165mL OMNIPAQUE IOHEXOL 350 MG/ML SOLN  COMPARISON:  Chest x-ray 10/19/2013  FINDINGS: Mediastinum: Heterogeneous and multinodular thyroid gland. There is a 1.9 cm hypoechoic nodule with dystrophic peripheral calcifications in the inferior thyroid isthmus and a 1.3 cm low-attenuation nodule in the inferior left thyroid gland. Low right paratracheal lymph node is borderline enlarged at 10 mm in short axis. Unremarkable visualized thoracic esophagus.  Heart/Vascular: Is excellent opacification of the pulmonary arteries to the proximal subsegmental level. No central filling defect to suggest acute  pulmonary embolus. The main and central pulmonary arteries are within normal limits for size. The heart is within normal limits for size. Atherosclerotic calcifications are noted throughout the coronary arteries. No pericardial effusion. Mild ectasia of the ascending thoracic aorta at 4.2 cm. There is some  calcification and slight thickening of the aortic valve leaflets. A right IJ approach port a catheter is present. The catheter tip is noted within the superior cavoatrial junction.  Lungs/Pleura: Innumerable pulmonary nodules in a random distribution most consistent with hematogenous metastases. Nodules are noted throughout all lobes of both lungs but there is a deep tendon predominance which is also consistent with hematogenous distribution the largest single nodules identified in the right lower lobe and measures 1.7 cm in maximal diameter. Additional patchy ground-glass attenuation opacities are noted in a peribronchovascular distribution in the bilateral lower lobes concerning for multi focal pneumonia versus aspiration. No pneumothorax and no evidence of pleural effusion or pulmonary edema.  Bones/Soft Tissues: No acute fracture or aggressive appearing lytic or blastic osseous lesion. Enlarged left axillary lymph nodes maximal size 3.3 x 2.6 cm. Diffuse skin thickening and reticulation of the subcutaneous fat consistent with anasarca. Prominent right axillary lymph node also enlarged at 1.8 x 2.2 cm.  Upper Abdomen: Incompletely evaluated innumerable hepatic lesions throughout both the left and right hepatic lobes. The largest definitive lesion measures approximately 5.6 cm in diameter in the mid right hepatic lobe.  Review of the MIP images confirms the above findings.  IMPRESSION: 1. Negative for acute pulmonary embolus 2. Patchy ground-glass attenuation airspace opacity in a bronchovascular distribution in the bilateral lower lobes concerning for multifocal pneumonia or aspiration. 3. CT findings are consistent with widespread metastatic disease with innumerable bilateral pulmonary nodules, bilateral axillary adenopathy and innumerable hepatic lesions. 4. 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 5. Thyroid nodules are nonspecific by CT scan. If clinically warranted, dedicated thyroid  ultrasound could further evaluate. This may be of limited clinical utility given the patient's comorbidities. 6. Thickening and calcification of the aortic valve with ectasia of the ascending thoracic aorta which measures up to 4.2 cm in diameter. There may be underlying aortic stenosis. Echocardiography could further evaluate if clinically warranted.   Electronically Signed   By: Jacqulynn Cadet M.D.   On: 10/20/2013 18:20   Ct Abdomen Pelvis W Contrast  10/21/2013   CLINICAL DATA:  High fevers with unclear source. Evaluate for occult intra-abdominal abscess. Metastatic colon cancer.  EXAM: CT ABDOMEN AND PELVIS WITH CONTRAST  TECHNIQUE: Multidetector CT imaging of the abdomen and pelvis was performed using the standard protocol following bolus administration of intravenous contrast.  CONTRAST:  59mL OMNIPAQUE IOHEXOL 300 MG/ML  SOLN  COMPARISON:  None.  FINDINGS: As described on the chest CT from yesterday, there are innumerable pulmonary nodules within the lung bases.  There are innumerable hypoenhancing lesions throughout both lobes of the liver. A conglomeration of lesions in the inferior right hepatic lobe measures approximately 11.9 x 6.7 cm. Punctate gallstones are questioned. The spleen, adrenal glands, left kidney, and pancreas have an unremarkable enhanced appearance. Sub 5 mm hypodense lesions in the right kidney are too small to characterize.  Rounded, low-density structures in the porta hepatis measure up to 3.7 x 3.2 cm in size and compress the main portal vein and may reflect low-density porta hepatis lymph nodes. The portal vein appears patent without definite thrombus identified. Loculated fluid extending inferiorly from the porta hepatis measures 7.4 x 6.5 cm without definite rim enhancement. Small amount  of partially loculated fluid in the right paracolic gutter measures 4.5 x 2.5 cm and may demonstrate minimal, incomplete rim enhancement (series 2, image 41). A small amount of free fluid is  present in the left paracolic gutter and in the pelvis.  There is no evidence of bowel obstruction. Lobulated mass involving the skin at the superior margin of the surgical incision in the mid abdominal midline measures 4.2 x 2.2 cm. Rounded mass involving the anterior abdominal wall more inferiorly right of midline measures 3.9 x 3.6 cm. Additional areas of soft tissue nodularity are present in the lower anterior abdominal wall with involvement of the left rectus musculature (image 47). Enlarged left external iliac lymph node measures 3.5 x 2.6 cm. Left inguinal lymph node measures 2.4 x 1.5 cm.  The prostate is enlarged, measuring 5.8 cm in diameter, with multiple coarse calcifications. Excreted IV contrast is present in the renal collecting systems and bladder. Moderate atherosclerotic calcification is noted involving the abdominal aorta and iliac arteries. There is mild stranding throughout the abdominal and pelvic mesentery. There is mild to moderate anasarca. Heterogeneous appearance of the bones with areas of ill-defined sclerosis, particularly involving the upper sacrum, are nonspecific but concerning for metastases. Moderate lower lumbar facet arthrosis is present.  IMPRESSION: 1. Extensive metastatic disease involving the visualized lung bases and liver. There is pelvic and inguinal lymphadenopathy on the left, and multiple soft tissue masses involving the anterior abdominal wall. Bulky porta hepatis lymphadenopathy compresses the portal vein, which remains patent. 2. Loculated fluid collection in the right mid abdomen. No definite rim enhancement is identified, however infection is not excluded. There is also a small amount of fluid in the paracolic gutters, with fluid in the right paracolic gutter appearing partially loculated with possible minimal rim enhancement.   Electronically Signed   By: Logan Bores   On: 10/21/2013 03:02    Review of Systems  Constitutional: Positive for fever. Negative for  chills.  Eyes: Negative for blurred vision.  Respiratory: Positive for cough. Negative for shortness of breath.   Cardiovascular: Negative for chest pain.  Gastrointestinal: Negative for nausea, vomiting, abdominal pain, diarrhea and constipation.  Genitourinary: Negative for dysuria, urgency and frequency.  Skin: Negative for rash.  Neurological: Negative for headaches.   Blood pressure 172/91, pulse 69, temperature 99.2 F (37.3 C), temperature source Oral, resp. rate 18, height _0  (1.702 m), weight 144 lb 13.5 oz (65.7 kg), SpO2 98.00%. Physical Exam  Constitutional: He is oriented to person, place, and time. He appears well-developed and well-nourished.  HENT:  Head: Normocephalic and atraumatic.  Eyes: Conjunctivae are normal. Pupils are equal, round, and reactive to light.  Neck: Normal range of motion.  Cardiovascular: Normal rate and regular rhythm.   Respiratory: Effort normal and breath sounds normal.  GI: Soft. He exhibits no distension. There is no tenderness. There is no rebound and no guarding.  Neurological: He is alert and oriented to person, place, and time.  Skin: Skin is warm and dry.    Assessment/Plan: Pt has metastatic colon cancer with multiple intra-abd lesions and liver lesions.  He has not abd pain.  His wbc is normal and without a left shift.  He is non-distended and non-tender to palpation.  The fluid collections on CT are not rim enhancing and are consistent with loculated ascites, which is reasonable, given his disease burden.  He also has no signs of obstruction or any reason for bowel perforation that would cause an abscess.  Will sign off  Milli Woolridge C. [id number], 4:06 PM

## 2013-10-22 DIAGNOSIS — J189 Pneumonia, unspecified organism: Secondary | ICD-10-CM

## 2013-10-22 LAB — CBC WITH DIFFERENTIAL/PLATELET
BASOS ABS: 0 10*3/uL (ref 0.0–0.1)
Basophils Relative: 0 % (ref 0–1)
Eosinophils Absolute: 0.1 10*3/uL (ref 0.0–0.7)
Eosinophils Relative: 1 % (ref 0–5)
HEMATOCRIT: 32.3 % — AB (ref 39.0–52.0)
Hemoglobin: 11.4 g/dL — ABNORMAL LOW (ref 13.0–17.0)
LYMPHS PCT: 34 % (ref 12–46)
Lymphs Abs: 2.4 10*3/uL (ref 0.7–4.0)
MCH: 31.5 pg (ref 26.0–34.0)
MCHC: 35.3 g/dL (ref 30.0–36.0)
MCV: 89.2 fL (ref 78.0–100.0)
Monocytes Absolute: 0.5 10*3/uL (ref 0.1–1.0)
Monocytes Relative: 7 % (ref 3–12)
NEUTROS ABS: 4.2 10*3/uL (ref 1.7–7.7)
NEUTROS PCT: 59 % (ref 43–77)
Platelets: 123 10*3/uL — ABNORMAL LOW (ref 150–400)
RBC: 3.62 MIL/uL — ABNORMAL LOW (ref 4.22–5.81)
RDW: 14.2 % (ref 11.5–15.5)
WBC: 7.2 10*3/uL (ref 4.0–10.5)

## 2013-10-22 LAB — GLUCOSE, CAPILLARY
Glucose-Capillary: 133 mg/dL — ABNORMAL HIGH (ref 70–99)
Glucose-Capillary: 167 mg/dL — ABNORMAL HIGH (ref 70–99)
Glucose-Capillary: 153 mg/dL — ABNORMAL HIGH (ref 70–99)
Glucose-Capillary: 131 mg/dL — ABNORMAL HIGH (ref 70–99)

## 2013-10-22 LAB — COMPREHENSIVE METABOLIC PANEL
ALK PHOS: 132 U/L — AB (ref 39–117)
ALT: 20 U/L (ref 0–53)
AST: 30 U/L (ref 0–37)
Albumin: 2.3 g/dL — ABNORMAL LOW (ref 3.5–5.2)
BUN: 15 mg/dL (ref 6–23)
CO2: 24 mEq/L (ref 19–32)
Calcium: 9.1 mg/dL (ref 8.4–10.5)
Chloride: 98 mEq/L (ref 96–112)
Creatinine, Ser: 0.76 mg/dL (ref 0.50–1.35)
GFR calc non Af Amer: >90 mL/min (ref >90)
Glucose, Bld: 141 mg/dL — ABNORMAL HIGH (ref 70–99)
Potassium: 3.5 mEq/L — ABNORMAL LOW (ref 3.7–5.3)
Sodium: 134 mEq/L — ABNORMAL LOW (ref 137–147)
TOTAL PROTEIN: 6 g/dL (ref 6.0–8.3)
Total Bilirubin: 0.4 mg/dL (ref 0.3–1.2)

## 2013-10-22 LAB — CLOSTRIDIUM DIFFICILE BY PCR: CDIFFPCR: NEGATIVE

## 2013-10-22 MED ORDER — SACCHAROMYCES BOULARDII 250 MG PO CAPS
250.0000 mg | ORAL_CAPSULE | Freq: Two times a day (BID) | ORAL | Status: DC
  Administered 2013-10-22 – 2013-10-23 (×2): 250 mg via ORAL
  Filled 2013-10-22 (×3): qty 1

## 2013-10-22 MED ORDER — LEVOFLOXACIN IN D5W 750 MG/150ML IV SOLN
750.0000 mg | INTRAVENOUS | Status: DC
  Administered 2013-10-22: 750 mg via INTRAVENOUS
  Filled 2013-10-22: qty 150

## 2013-10-22 NOTE — Progress Notes (Signed)
ANTIBIOTIC CONSULT NOTE - INITIAL  Pharmacy Consult for Levaquin Indication: HCAP  No Known Allergies  Patient Measurements: Height: 5\' 7"  (170.2 cm) Weight: 144 lb 13.5 oz (65.7 kg) IBW/kg (Calculated) : 66.1   Vital Signs: Temp: 97.1 F (36.2 C) (02/15 1400) Temp src: Oral (02/15 1400) BP: 136/80 mmHg (02/15 1400) Pulse Rate: 66 (02/15 1400) Intake/Output from previous day: 02/14 0701 - 02/15 0700 In: 8676 [P.O.:720; I.V.:2260; IV Piggyback:450] Out: 800 [Urine:800] Intake/Output from this shift:    Labs:  Recent Labs  10/20/13 0233 10/21/13 0616 10/22/13 0553  WBC 5.8 7.1 7.2  HGB 11.7* 11.2* 11.4*  PLT 100* 112* 123*  CREATININE 0.78 0.68 0.76   Estimated Creatinine Clearance: 81 ml/min (by C-G formula based on Cr of 0.76). No results found for this basename: VANCOTROUGH, VANCOPEAK, VANCORANDOM, [place], GENTPEAK, GENTRANDOM, TOBRATROUGH, TOBRAPEAK, TOBRARND, AMIKACINPEAK, AMIKACINTROU, AMIKACIN,  in the last 72 hours   Microbiology: Recent Results (from the past 720 hour(s))  CULTURE, BLOOD (ROUTINE X 2)     Status: None   Collection Time    10/19/13  8:30 PM      Result Value Ref Range Status   Specimen Description BLOOD PORTA CATH   Final   Special Requests BOTTLES DRAWN AEROBIC AND ANAEROBIC 5CC   Final   Culture  Setup Time     Final   Value: 10/20/2013 00:42     Performed at [facility]   Culture     Final   Value:        BLOOD CULTURE RECEIVED NO GROWTH TO DATE CULTURE WILL BE HELD FOR 5 DAYS BEFORE ISSUING A FINAL NEGATIVE REPORT     Performed at [facility]   Report Status PENDING   Incomplete  URINE CULTURE     Status: None   Collection Time    10/20/13  1:00 AM      Result Value Ref Range Status   Specimen Description URINE, CLEAN CATCH   Final   Special Requests NONE   Final   Culture  Setup Time     Final   Value: 10/20/2013 04:46     Performed at [place]     Final   Value: NO GROWTH     Performed at [facility]   Culture     Final   Value: NO GROWTH     Performed at [facility]   Report Status 10/21/2013 FINAL   Final  CULTURE, BLOOD (ROUTINE X 2)     Status: None   Collection Time    10/20/13  2:33 AM      Result Value Ref Range Status   Specimen Description BLOOD LEFT FOREARM   Final   Special Requests BOTTLES DRAWN AEROBIC ONLY 2ML   Final   Culture  Setup Time     Final   Value: 10/20/2013 08:26     Performed at [facility]   Culture     Final   Value:        BLOOD CULTURE RECEIVED NO GROWTH TO DATE CULTURE WILL BE HELD FOR 5 DAYS BEFORE ISSUING A FINAL NEGATIVE REPORT     Performed at [facility]   Report Status PENDING   Incomplete  CLOSTRIDIUM DIFFICILE BY PCR     Status: None   Collection Time    10/22/13 12:45 PM      Result Value Ref Range Status   C difficile by  pcr NEGATIVE  NEGATIVE Final   Comment: Performed at [place] History: Past Medical History  Diagnosis Date  . Cancer     colon cancer  . Hypertension   . Diabetes mellitus without complication     Medications:  Scheduled:  . feeding supplement (ENSURE COMPLETE)  237 mL Oral BID BM  . hydrALAZINE  50 mg Oral TID  . insulin aspart  0-9 Units Subcutaneous TID WC  . labetalol  200 mg Oral TID  . lisinopril  40 mg Oral Daily  . saccharomyces boulardii  250 mg Oral BID  . sodium chloride  500 mL Intravenous Once   Infusions:  . sodium chloride 20 mL/hr (10/21/13 1515)   Assessment: 68 yo male admitted with complaints of weakness/altered mental status. Patient w/ stage IV colon cancer.  Cefepime and Vancomycin ordered initially for sepsis 2/2 HCAP, today antibiotics narrowed to levaquin.   2/13 >>cefepime >> 2/15 2/13 >>vancomcyin >> 2/15 2/15 >> Levaquin >>  CrCl 81 ml/min (SCr stable) WBC now WNL Afebrile   Goal of Therapy:  Vancomycin trough level 15-20 mcg/ml  Plan:  Levaquin 750mg  IV q 24 hours, f/u  transition to PO  Ralene Bathe, PharmD, BCPS 10/22/2013, 4:43 PM  Pager: [phone]
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
## 2013-10-22 NOTE — Progress Notes (Signed)
Palliative Consult received. Goals of care meeting scheduled for 7AM 2/16.  Lane Hacker, DO Palliative Medicine

## 2013-10-22 NOTE — Progress Notes (Signed)
Send this page to Dr. Sheran Fava. [phone] Jonathon Hall, has had one loose and one watery stool, if has another do you want me to send for C-diff, on Vanc & Maxipime.  Thanks. )

## 2013-10-23 LAB — COMPREHENSIVE METABOLIC PANEL
ALT: 18 U/L (ref 0–53)
AST: 28 U/L (ref 0–37)
Albumin: 2.3 g/dL — ABNORMAL LOW (ref 3.5–5.2)
Alkaline Phosphatase: 126 U/L — ABNORMAL HIGH (ref 39–117)
BUN: 13 mg/dL (ref 6–23)
CO2: 24 mEq/L (ref 19–32)
Calcium: 9.3 mg/dL (ref 8.4–10.5)
Chloride: 99 mEq/L (ref 96–112)
Creatinine, Ser: 0.81 mg/dL (ref 0.50–1.35)
GFR calc Af Amer: >90 mL/min (ref >90)
GFR calc non Af Amer: 89 mL/min — ABNORMAL LOW (ref >90)
Glucose, Bld: 128 mg/dL — ABNORMAL HIGH (ref 70–99)
POTASSIUM: 3.2 meq/L — AB (ref 3.7–5.3)
SODIUM: 136 meq/L — AB (ref 137–147)
TOTAL PROTEIN: 6 g/dL (ref 6.0–8.3)
Total Bilirubin: 0.4 mg/dL (ref 0.3–1.2)

## 2013-10-23 LAB — GLUCOSE, CAPILLARY
GLUCOSE-CAPILLARY: 121 mg/dL — AB (ref 70–99)
Glucose-Capillary: 130 mg/dL — ABNORMAL HIGH (ref 70–99)
Glucose-Capillary: 139 mg/dL — ABNORMAL HIGH (ref 70–99)

## 2013-10-23 LAB — CBC WITH DIFFERENTIAL/PLATELET
BASOS ABS: 0 10*3/uL (ref 0.0–0.1)
BASOS PCT: 0 % (ref 0–1)
Eosinophils Absolute: 0.1 10*3/uL (ref 0.0–0.7)
Eosinophils Relative: 1 % (ref 0–5)
HEMATOCRIT: 30 % — AB (ref 39.0–52.0)
Hemoglobin: 10.5 g/dL — ABNORMAL LOW (ref 13.0–17.0)
Lymphocytes Relative: 40 % (ref 12–46)
Lymphs Abs: 2.2 10*3/uL (ref 0.7–4.0)
MCH: 31.4 pg (ref 26.0–34.0)
MCHC: 35 g/dL (ref 30.0–36.0)
MCV: 89.8 fL (ref 78.0–100.0)
MONO ABS: 0.4 10*3/uL (ref 0.1–1.0)
Monocytes Relative: 8 % (ref 3–12)
Neutro Abs: 2.9 10*3/uL (ref 1.7–7.7)
Neutrophils Relative %: 51 % (ref 43–77)
Platelets: 141 10*3/uL — ABNORMAL LOW (ref 150–400)
RBC: 3.34 MIL/uL — ABNORMAL LOW (ref 4.22–5.81)
RDW: 14.3 % (ref 11.5–15.5)
WBC: 5.6 10*3/uL (ref 4.0–10.5)

## 2013-10-23 MED ORDER — ENSURE COMPLETE PO LIQD: 237.0000 mL | Freq: Two times a day (BID) | ORAL | Status: AC

## 2013-10-23 MED ORDER — LACTULOSE 10 GM/15ML PO SOLN: 10.0000 g | Freq: Every day | ORAL | Status: AC

## 2013-10-23 MED ORDER — LEVOFLOXACIN 750 MG PO TABS: 750.0000 mg | ORAL_TABLET | Freq: Every day | ORAL | Status: AC

## 2013-10-23 MED ORDER — LEVOFLOXACIN 750 MG PO TABS
750.0000 mg | ORAL_TABLET | Freq: Every day | ORAL | Status: DC
  Filled 2013-10-23: qty 1

## 2013-10-23 MED ORDER — POTASSIUM CHLORIDE CRYS ER 20 MEQ PO TBCR
40.0000 meq | EXTENDED_RELEASE_TABLET | Freq: Once | ORAL | Status: AC
  Administered 2013-10-23: 40 meq via ORAL
  Filled 2013-10-23: qty 2

## 2013-10-23 MED ORDER — LABETALOL HCL 200 MG PO TABS: 200.0000 mg | ORAL_TABLET | Freq: Three times a day (TID) | ORAL | Status: AC

## 2013-10-23 MED ORDER — LORAZEPAM 1 MG PO TABS: 1.0000 mg | ORAL_TABLET | Freq: Four times a day (QID) | ORAL | Status: AC | PRN

## 2013-10-23 MED ORDER — SACCHAROMYCES BOULARDII 250 MG PO CAPS: 250.0000 mg | ORAL_CAPSULE | Freq: Two times a day (BID) | ORAL | Status: AC

## 2013-10-23 MED ORDER — OXYCODONE HCL 5 MG PO CAPS: 5.0000 mg | ORAL_CAPSULE | Freq: Four times a day (QID) | ORAL | Status: AC | PRN

## 2013-10-23 MED ORDER — LISINOPRIL 40 MG PO TABS: 40.0000 mg | ORAL_TABLET | Freq: Every day | ORAL | Status: AC

## 2013-10-23 MED ORDER — HYDRALAZINE HCL 100 MG PO TABS: 100.0000 mg | ORAL_TABLET | Freq: Three times a day (TID) | ORAL | Status: AC

## 2013-10-23 NOTE — Care Management Note (Unsigned)
    Page 1 of 1   10/23/2013     1:26:10 PM   CARE MANAGEMENT NOTE 10/23/2013  Patient:  Jonathon Hall, Jonathon Hall   Account Number:  [id number]  Date Initiated:  10/20/2013  Documentation initiated by:  [facility]  Subjective/Objective Assessment:   68 year old male admitted with hx met colon cancer admitted with AMS.     Action/Plan:   From home with wife. Hospice services at d/c   Anticipated DC Date:  10/23/2013   Anticipated DC Plan:  [place]  CM consult      Choice offered to / List presented to:  C-4 Adult Children           HH agency  [facility]   Status of service:  In process, will continue to follow Medicare Important Message given?  NA - LOS <3 / Initial given by admissions (If response is "NO", the following Medicare IM given date fields will be blank) Date Medicare IM given:   Date Additional Medicare IM given:    Discharge Disposition:    Per UR Regulation:  Reviewed for med. necessity/level of care/duration of stay  If discussed at [place] of Stay Meetings, dates discussed:    Comments:  10/23/13 Allene Dillon RN BSN 12N Spoke with Lesleigh Noe with palliative care liaison to  inform her that family had chosen [facility] to provide hospice services at home. She stated that she is not able to see him as she is tied up at [facility]. She asked me to fax intake information to the referral center including the daughter's address where he is discharging to.  1320 I spoke with Vickie from [place] to follow up on the referral. She stated she spoke with pt's daughter Ivin Booty and informed her that pt cannot be admitted for hospice services at this time since he plans on going to see his oncologist next week for more chemotherapy.
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
## 2013-10-23 NOTE — Progress Notes (Signed)
Patient meets P and T Committee Criteria for auto conversion of IV antibiotic to PO: 1. Tolerating diet and other po meds.  2. No malabsorption syndrome. 3. Afebrile 4. MD notes indicate improvement.  Will change Levaquin to po per P and T Committee policy.   Adrian Saran, PharmD, BCPS Pager [phone] 10/23/2013 9:54 AM
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
## 2013-10-26 LAB — CULTURE, BLOOD (ROUTINE X 2)
Culture: NO GROWTH
Culture: NO GROWTH

## 2013-12-06 DEATH — deceased

## 2015-09-13 IMAGING — CT CT ANGIO CHEST
2 of 6 series · 17 of 36 positions shown · IV contrast (OMNIPAQUE)
Comparison: Chest x-ray 10/19/2013

CLINICAL DATA: Metastatic cancer, persistent tachypnea and
shortness of breath

EXAM:
CT ANGIOGRAPHY CHEST WITH CONTRAST
TECHNIQUE: Multidetector CT imaging of the chest was performed using the
standard protocol during bolus administration of intravenous
contrast. Multiplanar CT image reconstructions and MIPs were
obtained to evaluate the vascular anatomy.
CONTRAST:  100mL OMNIPAQUE IOHEXOL 350 MG/ML SOLN

[Series 6: pe thins @ 1mm · axial · 0.74mm/px · z∈[-254,-19]mm · 16 of 263 slices shown]
[im 14/263  lung]
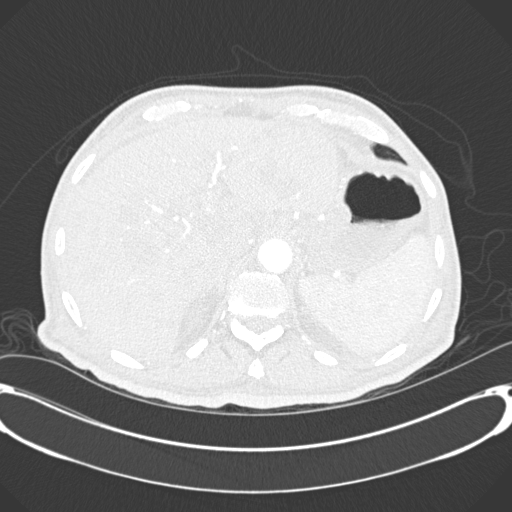
[im 27/263  mediastinal]
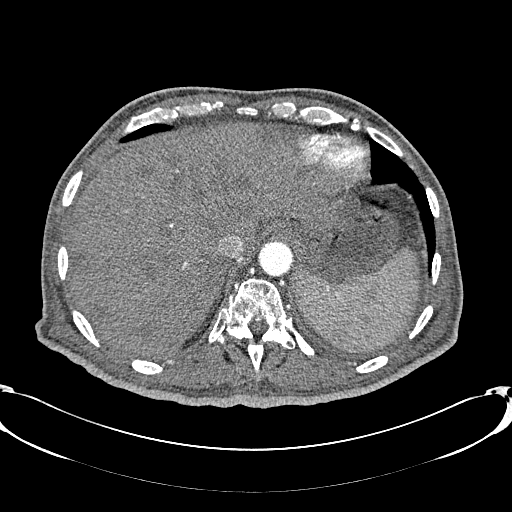
[im 40/263  lung]
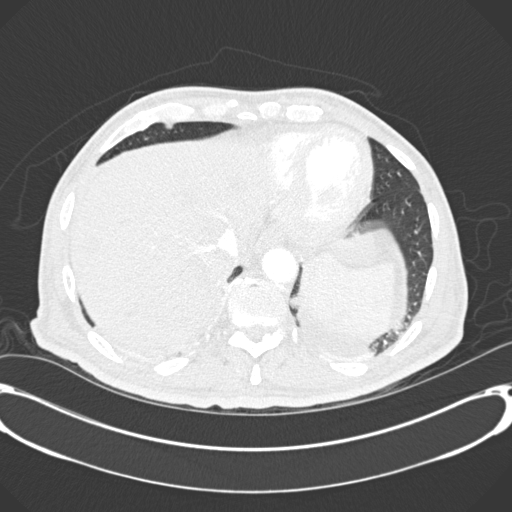
[im 66/263  mediastinal]
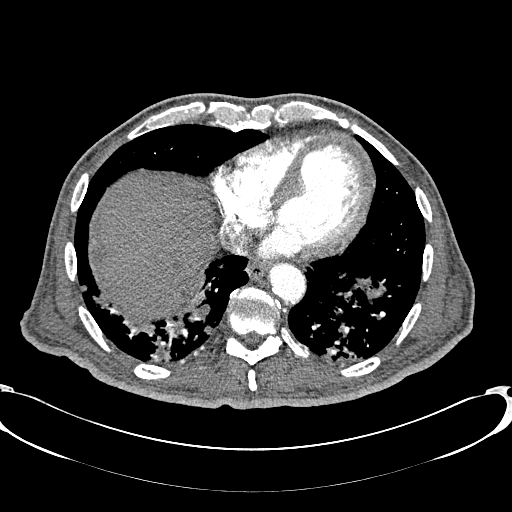
[im 79/263  lung]
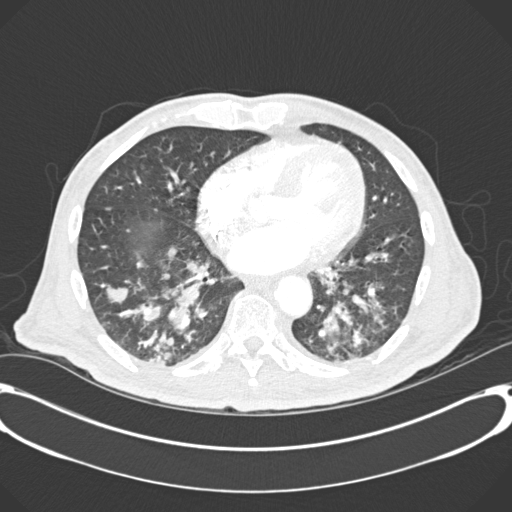
[im 92/263  mediastinal]
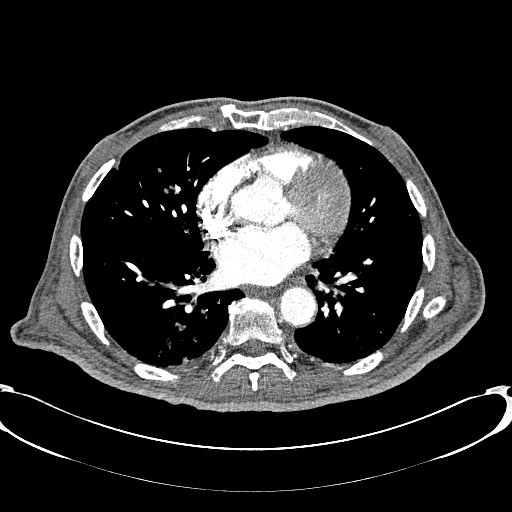
[im 105/263  lung]
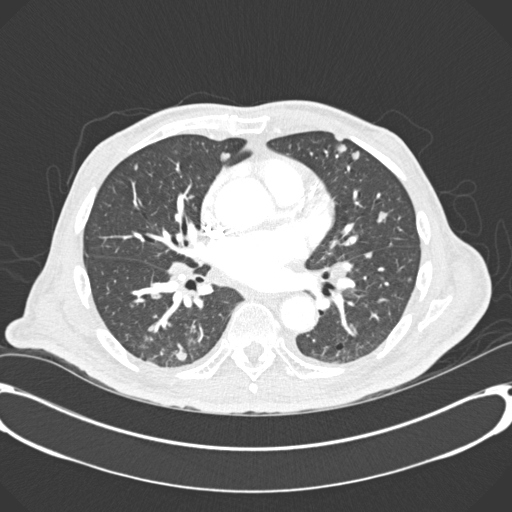
[im 118/263  mediastinal]
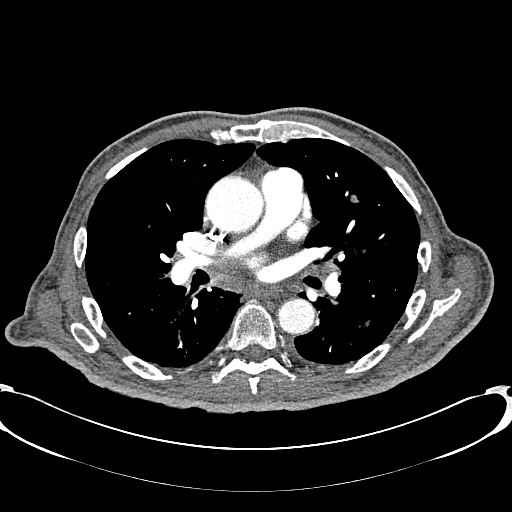
[im 145/263  lung]
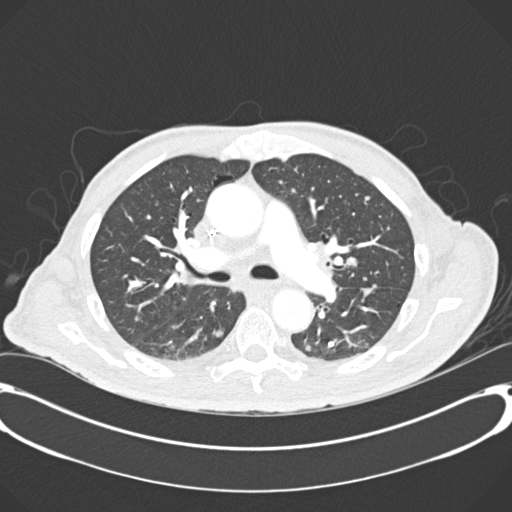
[im 158/263  mediastinal]
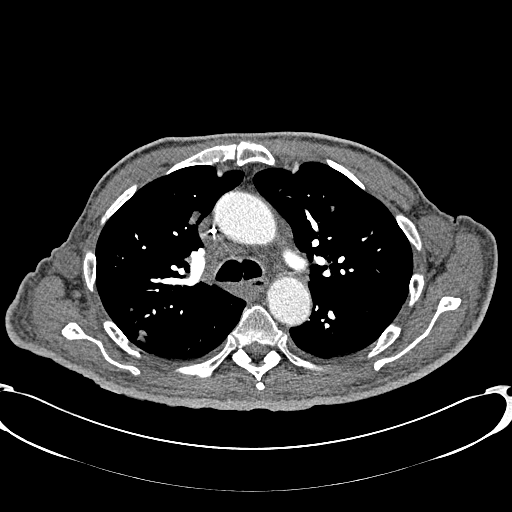
[im 171/263  lung]
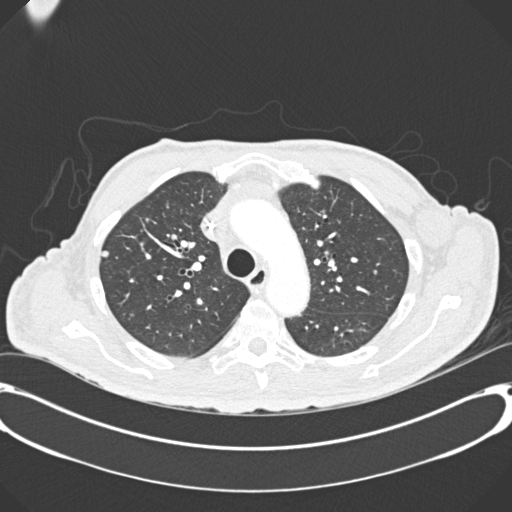
[im 184/263  mediastinal]
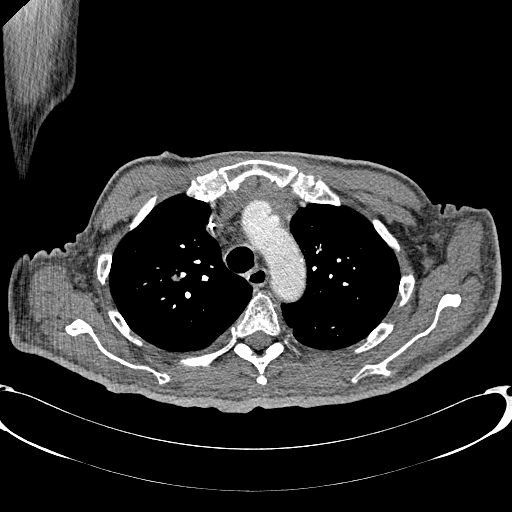
[im 197/263  lung]
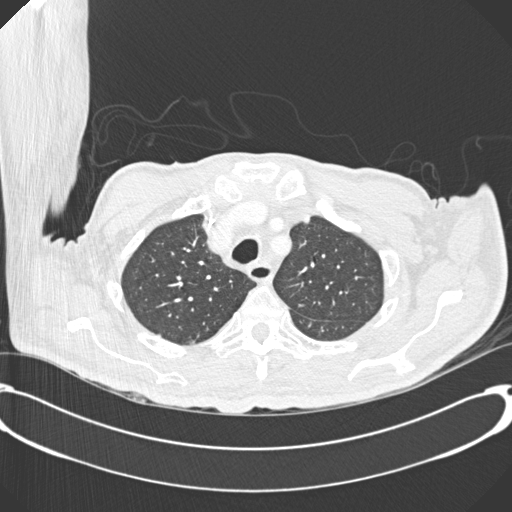
[im 223/263  mediastinal]
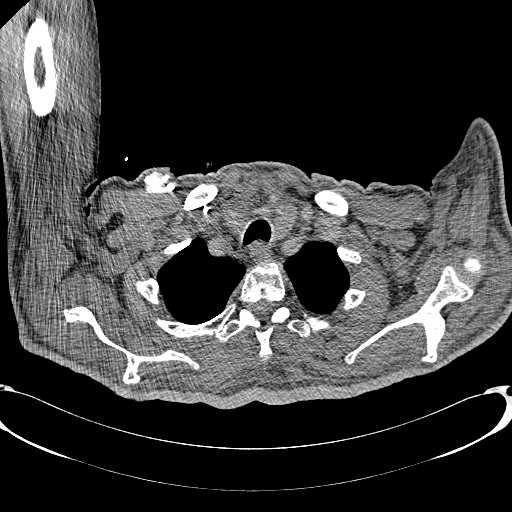
[im 236/263  lung]
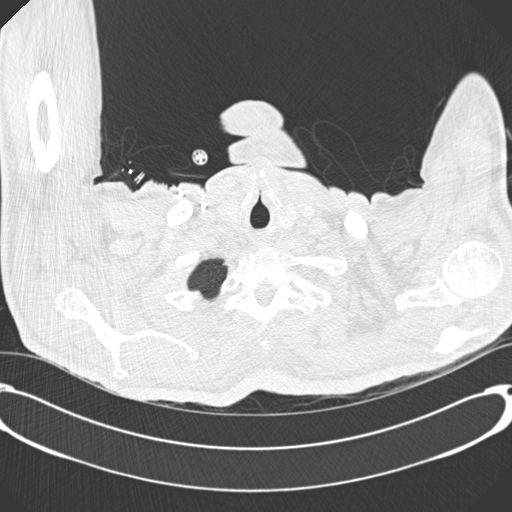
[im 249/263  mediastinal]
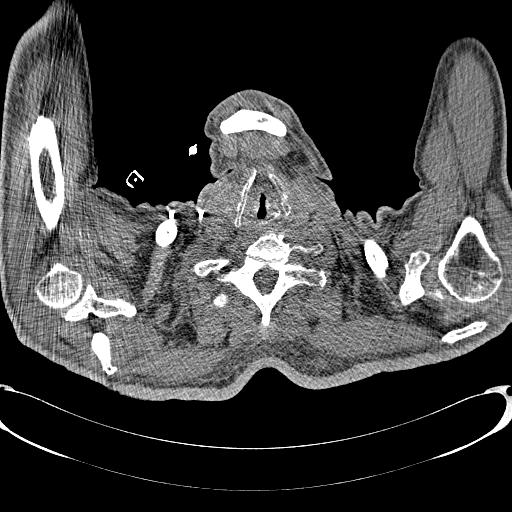

[Series 602: <mpr thick range> · coronal · 0.74mm/px · 1 of 109 slices shown]
[im 55/109  mediastinal]
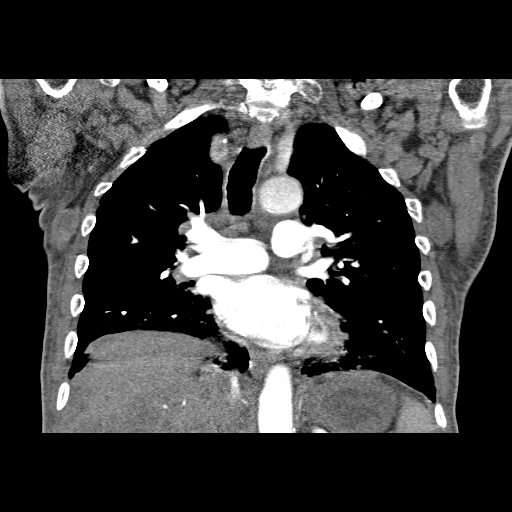

[17 of 36 positions shown; findings below may reference images not displayed]

FINDINGS: Mediastinum: Heterogeneous and multinodular thyroid gland. There is
a 1.9 cm hypoechoic nodule with dystrophic peripheral calcifications
in the inferior thyroid isthmus and a 1.3 cm low-attenuation nodule
in the inferior left thyroid gland. Low right paratracheal lymph
node is borderline enlarged at 10 mm in short axis. Unremarkable
visualized thoracic esophagus.

Heart/Vascular: Is excellent opacification of the pulmonary arteries
to the proximal subsegmental level. No central filling defect to
suggest acute pulmonary embolus. The main and central pulmonary
arteries are within normal limits for size. The heart is within
normal limits for size. Atherosclerotic calcifications are noted
throughout the coronary arteries. No pericardial effusion. Mild
ectasia of the ascending thoracic aorta at 4.2 cm. There is some
calcification and slight thickening of the aortic valve leaflets. A
right IJ approach port a catheter is present. The catheter tip is
noted within the superior cavoatrial junction.

Lungs/Pleura: Innumerable pulmonary nodules in a random distribution
most consistent with hematogenous metastases. Nodules are noted
throughout all lobes of both lungs but there is a deep tendon
predominance which is also consistent with hematogenous distribution
the largest single nodules identified in the right lower lobe and
measures 1.7 cm in maximal diameter. Additional patchy ground-glass
attenuation opacities are noted in a peribronchovascular
distribution in the bilateral lower lobes concerning for multi focal
pneumonia versus aspiration. No pneumothorax and no evidence of
pleural effusion or pulmonary edema.

Bones/Soft Tissues: No acute fracture or aggressive appearing lytic
or blastic osseous lesion. Enlarged left axillary lymph nodes
maximal size 3.3 x 2.6 cm. Diffuse skin thickening and reticulation
of the subcutaneous fat consistent with anasarca. Prominent right
axillary lymph node also enlarged at 1.8 x 2.2 cm.

Upper Abdomen: Incompletely evaluated innumerable hepatic lesions
throughout both the left and right hepatic lobes. The largest
definitive lesion measures approximately 5.6 cm in diameter in the
mid right hepatic lobe.

Review of the MIP images confirms the above findings.
IMPRESSION: 1. Negative for acute pulmonary embolus
2. Patchy ground-glass attenuation airspace opacity in a
bronchovascular distribution in the bilateral lower lobes concerning
for multifocal pneumonia or aspiration.
3. CT findings are consistent with widespread metastatic disease
with innumerable bilateral pulmonary nodules, bilateral axillary
adenopathy and innumerable hepatic lesions.
4. Atherosclerosis including multivessel coronary artery disease.
5. Thyroid nodules are nonspecific by CT scan. If clinically
warranted, dedicated thyroid ultrasound could further evaluate. This
may be of limited clinical utility given the patient's
comorbidities.
6. Thickening and calcification of the aortic valve with ectasia of
the ascending thoracic aorta which measures up to 4.2 cm in
diameter. There may be underlying aortic stenosis. Echocardiography
could further evaluate if clinically warranted.
